# Patient Record
Sex: Female | Born: 1965 | Race: White | Hispanic: No | Marital: Married
Health system: Southern US, Academic
[De-identification: ages and names within clinical notes are randomized; demographics above are authoritative.]

## PROBLEM LIST (undated history)

## (undated) ENCOUNTER — Telehealth

## (undated) ENCOUNTER — Encounter: Attending: Dermatology | Primary: Dermatology

## (undated) ENCOUNTER — Ambulatory Visit: Payer: MEDICARE

## (undated) ENCOUNTER — Ambulatory Visit

## (undated) ENCOUNTER — Ambulatory Visit: Attending: Dermatology | Primary: Dermatology

## (undated) DIAGNOSIS — M1990 Unspecified osteoarthritis, unspecified site: Secondary | ICD-10-CM

## (undated) DIAGNOSIS — K589 Irritable bowel syndrome without diarrhea: Secondary | ICD-10-CM

## (undated) DIAGNOSIS — Z9882 Breast implant status: Secondary | ICD-10-CM

## (undated) DIAGNOSIS — K219 Gastro-esophageal reflux disease without esophagitis: Secondary | ICD-10-CM

## (undated) DIAGNOSIS — R0600 Dyspnea, unspecified: Secondary | ICD-10-CM

## (undated) DIAGNOSIS — J90 Pleural effusion, not elsewhere classified: Secondary | ICD-10-CM

## (undated) HISTORY — DX: Dyspnea, unspecified: R06.00

## (undated) HISTORY — DX: Gastro-esophageal reflux disease without esophagitis: K21.9

## (undated) HISTORY — PX: ABDOMINAL HYSTERECTOMY: SHX81

## (undated) HISTORY — DX: Pleural effusion, not elsewhere classified: J90

---

## 1997-10-21 HISTORY — PX: BREAST ENHANCEMENT SURGERY: SHX7

## 1997-10-21 HISTORY — PX: AUGMENTATION MAMMAPLASTY: SUR837

## 2005-07-11 ENCOUNTER — Ambulatory Visit: Payer: Self-pay | Admitting: Gastroenterology

## 2007-12-15 ENCOUNTER — Ambulatory Visit: Payer: Self-pay | Admitting: Rheumatology

## 2008-04-13 ENCOUNTER — Emergency Department: Payer: Self-pay | Admitting: Emergency Medicine

## 2008-04-20 ENCOUNTER — Ambulatory Visit: Payer: Self-pay | Admitting: Unknown Physician Specialty

## 2008-05-12 ENCOUNTER — Ambulatory Visit: Payer: Self-pay | Admitting: Unknown Physician Specialty

## 2008-05-26 ENCOUNTER — Ambulatory Visit: Payer: Self-pay | Admitting: Unknown Physician Specialty

## 2008-08-17 ENCOUNTER — Ambulatory Visit: Payer: Self-pay

## 2008-09-30 ENCOUNTER — Ambulatory Visit: Payer: Self-pay | Admitting: Unknown Physician Specialty

## 2008-10-06 ENCOUNTER — Ambulatory Visit: Payer: Self-pay | Admitting: Unknown Physician Specialty

## 2011-08-08 ENCOUNTER — Ambulatory Visit: Payer: Self-pay | Admitting: Family Medicine

## 2012-12-15 ENCOUNTER — Ambulatory Visit: Payer: Self-pay

## 2013-06-24 ENCOUNTER — Ambulatory Visit: Payer: Self-pay | Admitting: Unknown Physician Specialty

## 2013-06-25 LAB — PATHOLOGY REPORT

## 2014-03-09 DIAGNOSIS — M1990 Unspecified osteoarthritis, unspecified site: Secondary | ICD-10-CM | POA: Insufficient documentation

## 2014-09-13 ENCOUNTER — Emergency Department: Payer: Self-pay | Admitting: Emergency Medicine

## 2014-09-13 LAB — URINALYSIS, COMPLETE
Bacteria: NONE SEEN
Bilirubin,UR: NEGATIVE
Blood: NEGATIVE
GLUCOSE, UR: NEGATIVE mg/dL (ref 0–75)
Leukocyte Esterase: NEGATIVE
Nitrite: NEGATIVE
Ph: 6 (ref 4.5–8.0)
Protein: NEGATIVE
SPECIFIC GRAVITY: 1.008 (ref 1.003–1.030)
SQUAMOUS EPITHELIAL: NONE SEEN

## 2014-09-13 LAB — COMPREHENSIVE METABOLIC PANEL
ALK PHOS: 96 U/L
ALT: 24 U/L
ANION GAP: 6 — AB (ref 7–16)
Albumin: 3.6 g/dL (ref 3.4–5.0)
BUN: 9 mg/dL (ref 7–18)
Bilirubin,Total: 0.3 mg/dL (ref 0.2–1.0)
CALCIUM: 9.2 mg/dL (ref 8.5–10.1)
Chloride: 103 mmol/L (ref 98–107)
Co2: 30 mmol/L (ref 21–32)
Creatinine: 0.72 mg/dL (ref 0.60–1.30)
EGFR (African American): >60
EGFR (Non-African Amer.): >60
Glucose: 81 mg/dL (ref 65–99)
Osmolality: 275 (ref 275–301)
Potassium: 3.6 mmol/L (ref 3.5–5.1)
SGOT(AST): 27 U/L (ref 15–37)
Sodium: 139 mmol/L (ref 136–145)
Total Protein: 6.7 g/dL (ref 6.4–8.2)

## 2014-09-13 LAB — CBC
HCT: 38.3 % (ref 35.0–47.0)
HGB: 12.6 g/dL (ref 12.0–16.0)
MCH: 30.9 pg (ref 26.0–34.0)
MCHC: 32.8 g/dL (ref 32.0–36.0)
MCV: 94 fL (ref 80–100)
PLATELETS: 199 10*3/uL (ref 150–440)
RBC: 4.08 10*6/uL (ref 3.80–5.20)
RDW: 12.3 % (ref 11.5–14.5)
WBC: 7 10*3/uL (ref 3.6–11.0)

## 2014-09-13 LAB — TROPONIN I

## 2014-09-13 LAB — CK TOTAL AND CKMB (NOT AT ARMC)
CK, TOTAL: 81 U/L (ref 26–192)
CK-MB: 0.7 ng/mL (ref 0.5–3.6)

## 2014-09-13 LAB — TSH: Thyroid Stimulating Horm: 1.79 u[IU]/mL

## 2014-09-13 LAB — PRO B NATRIURETIC PEPTIDE: B-TYPE NATIURETIC PEPTID: 86 pg/mL (ref 0–125)

## 2014-10-12 ENCOUNTER — Ambulatory Visit: Payer: Self-pay | Admitting: Family Medicine

## 2014-11-23 ENCOUNTER — Encounter: Payer: Self-pay | Admitting: Rheumatology

## 2014-12-20 ENCOUNTER — Encounter
Admit: 2014-12-20 | Disposition: A | Discharge status: Home / Self Care | Payer: Self-pay | Attending: Rheumatology | Admitting: Rheumatology

## 2015-02-06 ENCOUNTER — Emergency Department
Admit: 2015-02-06 | Disposition: A | Discharge status: Home / Self Care | Payer: Self-pay | Admitting: Emergency Medicine

## 2015-02-06 LAB — URINALYSIS, COMPLETE
BLOOD: NEGATIVE
Bacteria: NONE SEEN
Bilirubin,UR: NEGATIVE
GLUCOSE, UR: NEGATIVE mg/dL (ref 0–75)
Ketone: NEGATIVE
Nitrite: NEGATIVE
PH: 5 (ref 4.5–8.0)
SPECIFIC GRAVITY: 1.027 (ref 1.003–1.030)

## 2015-02-06 LAB — COMPREHENSIVE METABOLIC PANEL
ALBUMIN: 3.7 g/dL
ALK PHOS: 98 U/L
ANION GAP: 8 (ref 7–16)
BUN: 13 mg/dL
Bilirubin,Total: 0.3 mg/dL
CHLORIDE: 103 mmol/L
CREATININE: 0.58 mg/dL
Calcium, Total: 8.7 mg/dL — ABNORMAL LOW
Co2: 26 mmol/L
EGFR (African American): >60
EGFR (Non-African Amer.): >60
GLUCOSE: 108 mg/dL — AB
Potassium: 3.7 mmol/L
SGOT(AST): 31 U/L
SGPT (ALT): 24 U/L
Sodium: 137 mmol/L
Total Protein: 6.7 g/dL

## 2015-02-06 LAB — CBC
HCT: 38.5 % (ref 35.0–47.0)
HGB: 12.8 g/dL (ref 12.0–16.0)
MCH: 29.3 pg (ref 26.0–34.0)
MCHC: 33.3 g/dL (ref 32.0–36.0)
MCV: 88 fL (ref 80–100)
Platelet: 271 10*3/uL (ref 150–440)
RBC: 4.38 10*6/uL (ref 3.80–5.20)
RDW: 13.3 % (ref 11.5–14.5)
WBC: 6.9 10*3/uL (ref 3.6–11.0)

## 2015-03-23 ENCOUNTER — Other Ambulatory Visit: Payer: Self-pay | Admitting: Nurse Practitioner

## 2015-03-23 DIAGNOSIS — R1084 Generalized abdominal pain: Secondary | ICD-10-CM

## 2015-03-23 DIAGNOSIS — R748 Abnormal levels of other serum enzymes: Secondary | ICD-10-CM

## 2015-03-30 ENCOUNTER — Ambulatory Visit: Admission: RE | Admit: 2015-03-30 | Payer: Self-pay | Source: Ambulatory Visit

## 2015-03-30 ENCOUNTER — Ambulatory Visit
Admission: RE | Admit: 2015-03-30 | Discharge: 2015-03-30 | Disposition: A | Discharge status: Home / Self Care | Payer: Self-pay | Source: Ambulatory Visit | Attending: Nurse Practitioner | Admitting: Nurse Practitioner

## 2015-03-30 DIAGNOSIS — M549 Dorsalgia, unspecified: Secondary | ICD-10-CM | POA: Insufficient documentation

## 2015-03-30 DIAGNOSIS — R197 Diarrhea, unspecified: Secondary | ICD-10-CM | POA: Insufficient documentation

## 2015-03-30 DIAGNOSIS — R05 Cough: Secondary | ICD-10-CM | POA: Insufficient documentation

## 2015-03-30 DIAGNOSIS — R1110 Vomiting, unspecified: Secondary | ICD-10-CM | POA: Insufficient documentation

## 2015-03-30 HISTORY — DX: Irritable bowel syndrome, unspecified: K58.9

## 2015-03-30 HISTORY — DX: Breast implant status: Z98.82

## 2015-03-30 MED ORDER — IOHEXOL 350 MG/ML SOLN
100.0000 mL | Freq: Once | INTRAVENOUS | Status: AC | PRN
  Administered 2015-03-30: 100 mL via INTRAVENOUS

## 2015-03-31 ENCOUNTER — Emergency Department
Admission: EM | Admit: 2015-03-31 | Discharge: 2015-03-31 | Disposition: A | Discharge status: Home / Self Care | Payer: Self-pay | Attending: Emergency Medicine | Admitting: Emergency Medicine

## 2015-03-31 ENCOUNTER — Encounter: Payer: Self-pay | Admitting: Emergency Medicine

## 2015-03-31 ENCOUNTER — Other Ambulatory Visit: Payer: Self-pay

## 2015-03-31 ENCOUNTER — Emergency Department: Payer: Self-pay

## 2015-03-31 DIAGNOSIS — R112 Nausea with vomiting, unspecified: Secondary | ICD-10-CM | POA: Insufficient documentation

## 2015-03-31 DIAGNOSIS — R079 Chest pain, unspecified: Secondary | ICD-10-CM

## 2015-03-31 DIAGNOSIS — Z87891 Personal history of nicotine dependence: Secondary | ICD-10-CM | POA: Insufficient documentation

## 2015-03-31 DIAGNOSIS — R0789 Other chest pain: Secondary | ICD-10-CM | POA: Insufficient documentation

## 2015-03-31 DIAGNOSIS — J90 Pleural effusion, not elsewhere classified: Secondary | ICD-10-CM | POA: Insufficient documentation

## 2015-03-31 DIAGNOSIS — R0602 Shortness of breath: Secondary | ICD-10-CM

## 2015-03-31 DIAGNOSIS — R1084 Generalized abdominal pain: Secondary | ICD-10-CM | POA: Insufficient documentation

## 2015-03-31 LAB — BASIC METABOLIC PANEL
ANION GAP: 9 (ref 5–15)
BUN: <5 mg/dL — ABNORMAL LOW (ref 6–20)
CO2: 30 mmol/L (ref 22–32)
Calcium: 9 mg/dL (ref 8.9–10.3)
Chloride: 100 mmol/L — ABNORMAL LOW (ref 101–111)
Creatinine, Ser: 0.59 mg/dL (ref 0.44–1.00)
GFR calc Af Amer: >60 mL/min (ref >60)
GFR calc non Af Amer: >60 mL/min (ref >60)
Glucose, Bld: 90 mg/dL (ref 65–99)
Potassium: 3.8 mmol/L (ref 3.5–5.1)
Sodium: 139 mmol/L (ref 135–145)

## 2015-03-31 LAB — URINALYSIS COMPLETE WITH MICROSCOPIC (ARMC ONLY)
Bacteria, UA: NONE SEEN
Bilirubin Urine: NEGATIVE
Glucose, UA: NEGATIVE mg/dL
Hgb urine dipstick: NEGATIVE
Ketones, ur: NEGATIVE mg/dL
NITRITE: NEGATIVE
PH: 7 (ref 5.0–8.0)
Protein, ur: NEGATIVE mg/dL
RBC / HPF: NONE SEEN RBC/hpf (ref 0–5)
SPECIFIC GRAVITY, URINE: 1.017 (ref 1.005–1.030)

## 2015-03-31 LAB — CBC
HEMATOCRIT: 40.1 % (ref 35.0–47.0)
Hemoglobin: 13.1 g/dL (ref 12.0–16.0)
MCH: 28.4 pg (ref 26.0–34.0)
MCHC: 32.6 g/dL (ref 32.0–36.0)
MCV: 87.1 fL (ref 80.0–100.0)
PLATELETS: 280 10*3/uL (ref 150–440)
RBC: 4.6 MIL/uL (ref 3.80–5.20)
RDW: 13.9 % (ref 11.5–14.5)
WBC: 7.2 10*3/uL (ref 3.6–11.0)

## 2015-03-31 LAB — TROPONIN I: Troponin I: <0.03 ng/mL (ref <0.031)

## 2015-03-31 MED ORDER — SODIUM CHLORIDE 0.9 % IV SOLN
Freq: Once | INTRAVENOUS | Status: AC
  Administered 2015-03-31: 15:00:00 via INTRAVENOUS

## 2015-03-31 MED ORDER — IOHEXOL 350 MG/ML SOLN
100.0000 mL | Freq: Once | INTRAVENOUS | Status: AC | PRN
Start: 2015-03-31 — End: 2015-03-31
  Administered 2015-03-31: 75 mL via INTRAVENOUS

## 2015-03-31 NOTE — ED Notes (Signed)
Family at bedside. 

## 2015-03-31 NOTE — ED Notes (Signed)
Patient transported to CT 

## 2015-03-31 NOTE — Discharge Instructions (Signed)
Continue home medications. Rest.  As discussed it is very important to follow-up with pulmonology on Monday. See above to call to schedule first thing Monday morning.   Also as discussed return immediately to the ER for increased chest pain, shortness of breath, abdominal pain, fever, new or worsening concerns.   Chest Pain (Nonspecific) It is often hard to give a specific diagnosis for the cause of chest pain. There is always a chance that your pain could be related to something serious, such as a heart attack or a blood clot in the lungs. You need to follow up with your health care provider for further evaluation. CAUSES   Heartburn.  Pneumonia or bronchitis.  Anxiety or stress.  Inflammation around your heart (pericarditis) or lung (pleuritis or pleurisy).  A blood clot in the lung.  A collapsed lung (pneumothorax). It can develop suddenly on its own (spontaneous pneumothorax) or from trauma to the chest.  Shingles infection (herpes zoster virus). The chest wall is composed of bones, muscles, and cartilage. Any of these can be the source of the pain.  The bones can be bruised by injury.  The muscles or cartilage can be strained by coughing or overwork.  The cartilage can be affected by inflammation and become sore (costochondritis). DIAGNOSIS  Lab tests or other studies may be needed to find the cause of your pain. Your health care provider may have you take a test called an ambulatory electrocardiogram (ECG). An ECG records your heartbeat patterns over a 24-hour period. You may also have other tests, such as:  Transthoracic echocardiogram (TTE). During echocardiography, sound waves are used to evaluate how blood flows through your heart.  Transesophageal echocardiogram (TEE).  Cardiac monitoring. This allows your health care provider to monitor your heart rate and rhythm in real time.  Holter monitor. This is a portable device that records your heartbeat and can help  diagnose heart arrhythmias. It allows your health care provider to track your heart activity for several days, if needed.  Stress tests by exercise or by giving medicine that makes the heart beat faster. TREATMENT   Treatment depends on what may be causing your chest pain. Treatment may include:  Acid blockers for heartburn.  Anti-inflammatory medicine.  Pain medicine for inflammatory conditions.  Antibiotics if an infection is present.  You may be advised to change lifestyle habits. This includes stopping smoking and avoiding alcohol, caffeine, and chocolate.  You may be advised to keep your head raised (elevated) when sleeping. This reduces the chance of acid going backward from your stomach into your esophagus. Most of the time, nonspecific chest pain will improve within 2-3 days with rest and mild pain medicine.  HOME CARE INSTRUCTIONS   If antibiotics were prescribed, take them as directed. Finish them even if you start to feel better.  For the next few days, avoid physical activities that bring on chest pain. Continue physical activities as directed.  Do not use any tobacco products, including cigarettes, chewing tobacco, or electronic cigarettes.  Avoid drinking alcohol.  Only take medicine as directed by your health care provider.  Follow your health care provider's suggestions for further testing if your chest pain does not go away.  Keep any follow-up appointments you made. If you do not go to an appointment, you could develop lasting (chronic) problems with pain. If there is any problem keeping an appointment, call to reschedule. SEEK MEDICAL CARE IF:   Your chest pain does not go away, even after treatment.  You have a rash with blisters on your chest.  You have a fever. SEEK IMMEDIATE MEDICAL CARE IF:   You have increased chest pain or pain that spreads to your arm, neck, jaw, back, or abdomen.  You have shortness of breath.  You have an increasing cough,  or you cough up blood.  You have severe back or abdominal pain.  You feel nauseous or vomit.  You have severe weakness.  You faint.  You have chills. This is an emergency. Do not wait to see if the pain will go away. Get medical help at once. Call your local emergency services (911 in [place] Do not drive yourself to the hospital. MAKE SURE YOU:   Understand these instructions.  Will watch your condition.  Will get help right away if you are not doing well or get worse. Document Released: 07/17/2005 Document Revised: 10/12/2013 Document Reviewed: 05/12/2008 [facility] Patient Information 2015 [place], [place]. This information is not intended to replace advice given to you by your health care provider. Make sure you discuss any questions you have with your health care provider.  Pleural Effusion The lining covering your lungs and the inside of your chest is called the pleura. Usually, the space between the two pleura contains no air and only a thin layer of fluid. A pleural effusion is an abnormal buildup of fluid in the pleural space. Fluid gathers when there is increased pressure in the lung vessels. This forces fluids out of the lungs and into the pleural space. Vessels may also leak fluids when there are infections, such as pneumonia, or other causes of soreness and redness (inflammation). Fluids leak into the lungs when protein in the blood is low or when certain vessels (lymphatics) are blocked. Finding a pleural effusion is important because it is usually caused by another disease. In order to treat a pleural effusion, your health care provider needs to find its cause. If left untreated, a large amount of fluid can build up and cause collapse of the lung. CAUSES   Heart failure.  Infections (pneumonia, tuberculosis), pulmonary embolism, pulmonary infarction.  Cancer (primary lung and metastatic), asbestosis.  Liver failure (cirrhosis).  Nephrotic syndrome, peritoneal dialysis,  kidney problems (uremia).  Collagen vascular disease (systemic lupus erythematosus, rheumatoid arthritis).  Injury (trauma) to the chest or rupture of the digestive tube (esophagus).  Material in the chest or pleural space (hemothorax, chylothorax).  Pancreatitis.  Surgery.  Drug reactions. SYMPTOMS  A pleural effusion can decrease the amount of space available for breathing and make you short of breath. The fluid can become infected, which may cause pain and fever. Often, the pain is worse when taking a deep breath. The underlying disease (heart failure, pneumonia, blood clot, tuberculosis, cancer) may also cause symptoms. DIAGNOSIS   Your health care provider can usually tell what is wrong by talking to you (taking a history), doing an exam, and taking a routine X-ray. If the X-ray shows fluid in your chest, often fluid is removed from your chest with a needle for testing (diagnostic thoracentesis).  Sometimes, more specialized X-rays may be needed.  Sometimes, a small piece of tissue is removed and examined by a specialist (biopsy). TREATMENT  Treatment varies based on what caused the pleural effusion. Treatments include:  Removing as much fluid as possible using a needle (thoracentesis) to improve the cough and shortness of breath. This is a simple procedure that can be done at bedside. The risks are bleeding, infection, collapse of a lung, or low  blood pressure.  Placing a tube in the chest to drain the effusion (tube thoracostomy). This is often used when there is an infection in the fluid. This is a simple procedure that can often be done at bedside or in a clinic. The procedure may be painful. The risks are the same as using a needle to drain the fluid. The chest tube usually remains for a few days and is connected to suction to improve fluid drainage. After placement, the tube usually does not cause much discomfort.  Surgical removal of fibrous debris in and around the pleural  space (decortication). This may be done with a flexible telescope (thoracoscope) through a small or large cut (incision). This is helpful for patients who have fibrosis or scar tissue that prevents complete lung expansion. The risks are infection, blood loss, and side effects from general anesthesia.  Sometimes, a procedure called pleurodesis is done. A chest tube is placed and the fluid is drained. Next, an agent (tetracycline, talc powder) is added to the pleural space. This causes the lung and chest wall to stick together (adhesion). This leaves no potential space for fluid to build up. The risks include infection, blood loss, and side effects from general anesthesia.  If the effusion is caused by infection, it may be treated with antibiotics and may improve without draining. HOME CARE INSTRUCTIONS   Take any medicines exactly as prescribed.  Follow up with your health care provider as directed.  Monitor your exercise capacity (the amount of walking you can do before you get short of breath).  Do not use any tobacco products including cigarettes, chewing tobacco, or electronic cigarettes. SEEK MEDICAL CARE IF:   Your exercise capacity seems to get worse or does not improve with time.  You do not recover from your illness.  You have drainage, redness, swelling, or pain at any incision or puncture sites. SEEK IMMEDIATE MEDICAL CARE IF:   Shortness of breath or chest pain develops or gets worse.  You have a fever.  You develop a new cough, especially if the mucus (phlegm) is discolored. MAKE SURE YOU:   Understand these instructions.  Will watch your condition.  Will get help right away if you are not doing well or get worse. Document Released: 10/07/2005 Document Revised: 02/21/2014 Document Reviewed: 05/29/2007 [facility] Patient Information 2015 [place], [place]. This information is not intended to replace advice given to you by your health care provider. Make sure you discuss any  questions you have with your health care provider.

## 2015-03-31 NOTE — ED Notes (Signed)
Pt to ed with c/o chest pain intermittent x several months.  Pt reports sob, weakness, nausea and vomiting assoc with chest pain.

## 2015-03-31 NOTE — ED Provider Notes (Signed)
[facility] Emergency Department Provider Note ____________________________________________  Time seen: Approximately 2:11 PM  I have reviewed the triage vital signs and the nursing notes.   HISTORY  Chief Complaint Chest Pain   HPI Margaret Diaz is a 49 y.o. female presents to the ER for the complaint of 8 months of chest pain and shortness of breath. Patient states that she has also had intermittent nausea, vomiting and diarrhea and generalized abdominal pain throughout that 8 months as well however states that the GI symptoms have been more persistent for the last 2-3 weeks. States however the chest pain and shortness of breath has been persistent and constant during that 8-9 months. Patient describes the chest pain as pain across the front bra line of her chest. States that the pain is worse with movement as well as palpation but also present at rest. Describes pain as 7 out of 10. Patient states that shortness of breath also present at rest, but also increases with exertion.  Patient also reports 3 urinary tract infections in the last 8 months with last antibiotic being Cipro. Denies recent injury. Patient reports that she has been evaluated for this same by her primary care physician Dr. Burnett Sheng. Also reports that she is being followed by GI for abdominal complaints. Patient states that she had an outpatient CT abdomen yesterday and at that time they noticed that she had had a increase and a right lower pleural effusion and which that she was encouraged to follow up with her primary care physician. Patient states that her primary care physician is out of town and rest was referred to the ER for further management.   Past Medical History  Diagnosis Date  . Irritable bowel syndrome (IBS)   . Hx of bilateral breast implants     There are no active problems to display for this patient.   Past Surgical History  Procedure Laterality Date  . Abdominal hysterectomy       No current outpatient prescriptions on file. Patient reports that she is a chronic pain patient that follows with Dr. Laban Emperor and she takes oxycodone daily for "joint pain " Allergies Erythromycin  History reviewed. No pertinent family history.  Social History History  Substance Use Topics  . Smoking status: Former Games developer  . Smokeless tobacco: Not on file  . Alcohol Use: No    Review of Systems Constitutional: No fever/chills Eyes: No visual changes. ENT: No sore throat. Cardiovascular: Positive chest pain. Respiratory: Positive shortness of breath. Gastrointestinal: Positive for generalized abdominal pain, nausea, vomiting, diarrhea as above.  No constipation. Genitourinary: Negative for dysuria. Musculoskeletal: Negative for back pain. Skin: Negative for rash. Neurological: Negative for headaches, focal weakness or numbness.  10-point ROS otherwise negative.  ____________________________________________   PHYSICAL EXAM:  VITAL SIGNS: ED Triage Vitals  Enc Vitals Group     BP 03/31/15 1233 129/82 mmHg     Pulse Rate 03/31/15 1233 74     Resp 03/31/15 1233 18     Temp 03/31/15 1233 98.2 F (36.8 C)     Temp Source 03/31/15 1233 Oral     SpO2 03/31/15 1233 96 %     Weight 03/31/15 1233 170 lb (77.111 kg)     Height 03/31/15 1233  (1.702 m)     Head Cir --      Peak Flow --      Pain Score 03/31/15 1235 6     Pain Loc --  Pain Edu? --      Excl. in GC? --    Today's Vitals   03/31/15 1235 03/31/15 1509 03/31/15 1530 03/31/15 1818  BP:  115/85 109/76 130/88  Pulse:  70 73 81  Temp:      TempSrc:      Resp:  18  18  Height:      Weight:      SpO2:  99% 100% 99%  PainSc: 6  5   0-No pain   Constitutional: Alert and oriented. Well appearing and in no acute distress. Eyes: Conjunctivae are normal. PERRL. EOMI. Head: Atraumatic. Nose: No congestion/rhinnorhea. Mouth/Throat: Mucous membranes are moist.  Oropharynx non-erythematous. Neck:  No stridor.  No cervical spine tenderness to palpation. Hematological/Lymphatic/Immunilogical: No cervical lymphadenopathy. Cardiovascular: Normal rate, regular rhythm. Grossly normal heart sounds.  Good peripheral circulation. Mild to moderate tender to palpation lower chest as well as below left and right breast. No ecchymosis, swelling or erythema. Respiratory: Normal respiratory effort.  No retractions. Lungs CTAB. Except mildly decreased Right lower breath sounds. Gastrointestinal: Soft. No distention. No abdominal bruits. No CVA tenderness. Mild tender to palpation generalized. No guarding. Musculoskeletal: No lower extremity tenderness nor edema.  No joint effusions. Bilateral distal pedal pulses equal and easily found. Neurologic:  Normal speech and language. No gross focal neurologic deficits are appreciated. Speech is normal. No gait instability. Cranial nerve II through XII grossly intact. Skin:  Skin is warm, dry and intact. No rash noted. Psychiatric: Mood and affect are normal. Speech and behavior are normal.  ____________________________________________   LABS (all labs ordered are listed, but only abnormal results are displayed)  Labs Reviewed  BASIC METABOLIC PANEL - Abnormal; Notable for the following:    Chloride 100 (*)    BUN <5 (*)    All other components within normal limits  URINALYSIS COMPLETEWITH MICROSCOPIC ([facility] ONLY) - Abnormal; Notable for the following:    Color, Urine COLORLESS (*)    APPearance CLEAR (*)    Leukocytes, UA TRACE (*)    Squamous Epithelial / LPF 0-5 (*)    All other components within normal limits  C DIFFICILE QUICK SCAN W PCR REFLEX ([facility] ONLY)  CBC  TROPONIN I   ____________________________________________  EKG   ____________________________________________  RADIOLOGY Recent CT abdomen results reviewed from yesterday. CT ANGIOGRAPHY CHEST WITH CONTRAST  TECHNIQUE: Multidetector CT imaging of the chest was performed using  the standard protocol during bolus administration of intravenous contrast. Multiplanar CT image reconstructions and MIPs were obtained to evaluate the vascular anatomy.  CONTRAST: 75mL OMNIPAQUE IOHEXOL 350 MG/ML SOLN  COMPARISON: None.  FINDINGS: Chest wall: No breast masses are identified. Bilateral breast prosthesis are noted. No supraclavicular or axillary mass or adenopathy. The thyroid gland appears normal. The bony thorax is intact.  Mediastinum: The heart is normal in size. No pericardial effusion. Small amount of fluid in the pericardial recesses. No mediastinal or hilar mass or adenopathy. There are small perihilar lymph nodes bilaterally. The esophagus is grossly normal.  Vascular: The aorta is normal in caliber. No dissection. The branch vessels are patent. The pulmonary arterial tree is well opacified. No filling defects to suggest pulmonary emboli.  Lungs/ pleura: There is a moderate-sized right pleural effusion with overlying atelectasis. No left-sided pleural effusion but there is left basilar atelectasis along with other patchy areas of atelectasis. This also patchy ground-glass opacity which is a nonspecific finding but can be seen with edema, alveolitis, reactive airways disease and respiratory bronchiolitis.  Upper  abdomen: No significant findings.  Review of the MIP images confirms the above findings.  IMPRESSION: 1. Moderate-sized right pleural effusion with overlying atelectasis. 2. Patchy somewhat mosaic pattern of ground-glass opacity in the lungs could be due to edema, alveolitis, reactive airways disease or respiratory bronchiolitis. BOOP would also be possible. 3. No CT findings for pulmonary embolism. 4. Normal thoracic aorta.   Electronically Signed By: Rudie Meyer M.D. On: 03/31/2015 16:01 ____________________________________________   ____________________________________________   INITIAL IMPRESSION / ASSESSMENT AND PLAN /  ED COURSE  Pertinent labs & imaging results that were available during my care of the patient were reviewed by me and considered in my medical decision making (see chart for details).  Presents to the ER for the complaints of 8 months of chest pain and shortness of breath as well as with accompanying nausea, vomiting, diarrhea and generalized abdominal pain. Patient following with her primary care physician as well as GI for the same. Presents today as with recent CT abdomen recent as yesterday showing increased right pleural effusion. States here today for further evaluation. Very well-appearing patient in no acute distress. States chest pain and shortness of breath unchanged on last 8 months. Will evaluate by CT chest.  1740: Discussed pt and plan of care with Dr Shellee Milo (pulmonology) who also reviewed CT scan. Dr Welton Flakes recommends pt to follow up in his office on Monday for outpatient management and does not recommend admission at this time.Dr Welton Flakes recommends pt will need thoracentesis for evaluation and diagnosis. Per Dr Welton Flakes pt to follow up in office on Monday with him. Pt with normal and stable vital signs in ER, 100 % room air and 18 respirations at this time. Respirations nonlabored. NO acute distress.   Discussed patient and plan of care with Dr Lenard Lance who also agrees with plan. Pt with complaints which have been present over 8 months. No change in chest pain or shortness of breath. Ct chest positive for right sided pleural effusion. Pt to follow up with pulmonology on Monday. Very strict follow up and return parameters given to pt and family. Pt agreed to plan. Pt verbalized also wanting to go home.  ____________________________________________   FINAL CLINICAL IMPRESSION(S) / ED DIAGNOSES  Final diagnoses:  Pleural effusion  Chest pain, unspecified chest pain type  Shortness of breath      Renford Dills, NP 03/31/15 1905  Minna Antis, MD 03/31/15 2326

## 2015-03-31 NOTE — ED Provider Notes (Signed)
-----------------------------------------   6:18 PM on 03/31/2015 -----------------------------------------  EKG reviewed and interpreted by myself shows normal sinus rhythm at 85 bpm, narrow QRS, normal axis, normal intervals, nonspecific but no concerning ST changes are present.  Minna Antis, MD 03/31/15 [phone]

## 2015-03-31 NOTE — ED Notes (Signed)
Patient denies pain and is resting comfortably.  

## 2015-04-06 ENCOUNTER — Institutional Professional Consult (permissible substitution): Payer: Self-pay | Admitting: Internal Medicine

## 2015-04-10 ENCOUNTER — Ambulatory Visit (INDEPENDENT_AMBULATORY_CARE_PROVIDER_SITE_OTHER): Payer: Self-pay | Admitting: Internal Medicine

## 2015-04-10 ENCOUNTER — Encounter: Payer: Self-pay | Admitting: Internal Medicine

## 2015-04-10 VITALS — BP 104/76 | HR 83 | Ht 67.0 in | Wt 183.0 lb

## 2015-04-10 DIAGNOSIS — J90 Pleural effusion, not elsewhere classified: Secondary | ICD-10-CM

## 2015-04-10 DIAGNOSIS — J948 Other specified pleural conditions: Secondary | ICD-10-CM

## 2015-04-10 HISTORY — DX: Pleural effusion, not elsewhere classified: J90

## 2015-04-10 NOTE — Patient Instructions (Signed)
Please see patient coordinator before you leave today  to schedule ultrasound guided R thoracentesis

## 2015-04-10 NOTE — Progress Notes (Signed)
Subjective:    Patient ID: Margaret Diaz, female    DOB: Jun 16, 1966    MRN: [id number]  HPI  49 yowf quit smoking in 2006 s sequelae but bad arthritis/ followed by [facility]/ fall of 2015 developed cough/sob/ R cp with CT pos R effusion so referred to pulmonary clinic 04/10/2015  By EDP at [facility].    04/10/2015 1st [facility] office visit/ Aijalon Demuro   Chief Complaint  Patient presents with  . Pulmonary Consult    Self referral. Pt c/o SOB and CP "for quite some time"- worse x 2 wks. She states pain is worse when she takes a deep breath. She gets SOB with talking and walking "not too far". She also c/o cough- occ prod with grey sputum.   8 m  prior to OV  Onset bilateral R> L lower cp  hurt to deep breath with worse than usual arthritis  Then gradually worse sob and more localization of pain on R - "nothing helps"  No obvious   day to day or daytime variabilty or assoc chronic cough or cp or chest tightness, subjective wheeze overt sinus or hb symptoms. No unusual exp hx or h/o childhood pna/ asthma or knowledge of premature birth.  Sleeping ok without nocturnal  or early am exacerbation  of respiratory  c/o's or need for noct saba. Also denies any obvious fluctuation of symptoms with weather or environmental changes or other aggravating or alleviating factors except as outlined above   Current Medications, Allergies, Complete Past Medical History, Past Surgical History, Family History, and Social History were reviewed in [employer] record.              Review of Systems  Constitutional: Negative for fever, chills and unexpected weight change.  HENT: Positive for sore throat. Negative for congestion, dental problem, ear pain, nosebleeds, postnasal drip, rhinorrhea, sinus pressure, sneezing, trouble swallowing and voice change.   Eyes: Negative for visual disturbance.  Respiratory: Positive for cough and shortness of breath. Negative for choking.     Cardiovascular: Positive for chest pain. Negative for leg swelling.  Gastrointestinal: Positive for vomiting. Negative for abdominal pain and diarrhea.  Genitourinary: Negative for difficulty urinating.  Musculoskeletal: Negative for arthralgias.  Skin: Negative for rash.  Neurological: Negative for tremors, syncope and headaches.  Hematological: Does not bruise/bleed easily.       Objective:   Physical Exam  amb wf nad but has trouble climbing on exam table due to arthritis   Wt Readings from Last 3 Encounters:  04/10/15 183 lb (83.008 kg)  03/31/15 170 lb (77.111 kg)  03/30/15 175 lb (79.379 kg)    Vital signs reviewed  HEENT: nl dentition, turbinates, and orophanx. Nl external ear canals without cough reflex   NECK :  without JVD/Nodes/TM/ nl carotid upstrokes bilaterally   LUNGS: no acc muscle use, decreaesed bs with dullness R base - otherwise clear    CV:  RRR  no s3 or murmur or increase in P2, no edema   ABD:  soft and nontender with nl excursion in the supine position. No bruits or organomegaly, bowel sounds nl  MS:  warm without deformities, calf tenderness, cyanosis or clubbing  SKIN: warm and dry without lesions    NEURO:  alert, approp, no deficits      I personally reviewed images and agree with radiology impression as follows:   CTa  Chest 03/31/15  1. Moderate-sized right pleural effusion with overlying atelectasis. 2. Patchy  somewhat mosaic pattern of ground-glass opacity in the lungs could be due to edema, alveolitis, reactive airways disease or respiratory bronchiolitis. BOOP would also be possible. 3. No CT findings for pulmonary embolism.      Assessment & Plan:

## 2015-04-11 ENCOUNTER — Encounter: Payer: Self-pay | Admitting: Internal Medicine

## 2015-04-11 ENCOUNTER — Ambulatory Visit (HOSPITAL_COMMUNITY)
Admission: RE | Admit: 2015-04-11 | Discharge: 2015-04-11 | Disposition: A | Discharge status: Home / Self Care | Payer: Self-pay | Source: Ambulatory Visit | Attending: Radiology | Admitting: Radiology

## 2015-04-11 ENCOUNTER — Ambulatory Visit (HOSPITAL_COMMUNITY)
Admission: RE | Admit: 2015-04-11 | Discharge: 2015-04-11 | Disposition: A | Discharge status: Home / Self Care | Payer: Self-pay | Source: Ambulatory Visit | Attending: Internal Medicine | Admitting: Internal Medicine

## 2015-04-11 VITALS — BP 90/65

## 2015-04-11 DIAGNOSIS — J90 Pleural effusion, not elsewhere classified: Secondary | ICD-10-CM | POA: Insufficient documentation

## 2015-04-11 DIAGNOSIS — Z9889 Other specified postprocedural states: Secondary | ICD-10-CM

## 2015-04-11 LAB — PROTEIN, BODY FLUID: Total protein, fluid: 4.5 g/dL

## 2015-04-11 LAB — GLUCOSE, SEROUS FLUID: GLUCOSE FL: 83 mg/dL

## 2015-04-11 LAB — LACTATE DEHYDROGENASE, PLEURAL OR PERITONEAL FLUID: LD, Fluid: 281 U/L — ABNORMAL HIGH (ref 3–23)

## 2015-04-11 NOTE — Procedures (Signed)
US guided diagnostic/therapeutic right thoracentesis performed yielding 550 cc's turbid, amber fluid. The fluid was sent to the lab for preordered studies. F/u CXR pending. No immediate complications.

## 2015-04-11 NOTE — Assessment & Plan Note (Signed)
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

## 2015-04-12 NOTE — Progress Notes (Signed)
Quick Note:  Spoke with pt and notified of results per Dr. Wert. Pt verbalized understanding and denied any questions.  ______ 

## 2015-04-16 LAB — CULTURE, BODY FLUID-BOTTLE: CULTURE: NO GROWTH

## 2015-04-16 LAB — CULTURE, BODY FLUID W GRAM STAIN -BOTTLE

## 2015-04-26 ENCOUNTER — Ambulatory Visit (INDEPENDENT_AMBULATORY_CARE_PROVIDER_SITE_OTHER)
Admission: RE | Admit: 2015-04-26 | Discharge: 2015-04-26 | Disposition: A | Discharge status: Home / Self Care | Payer: Self-pay | Source: Ambulatory Visit | Attending: Internal Medicine | Admitting: Internal Medicine

## 2015-04-26 ENCOUNTER — Encounter: Payer: Self-pay | Admitting: Internal Medicine

## 2015-04-26 ENCOUNTER — Ambulatory Visit (INDEPENDENT_AMBULATORY_CARE_PROVIDER_SITE_OTHER): Payer: Self-pay | Admitting: Internal Medicine

## 2015-04-26 VITALS — BP 110/78 | HR 103 | Ht 67.0 in | Wt 186.0 lb

## 2015-04-26 DIAGNOSIS — J90 Pleural effusion, not elsewhere classified: Secondary | ICD-10-CM

## 2015-04-26 DIAGNOSIS — J948 Other specified pleural conditions: Secondary | ICD-10-CM

## 2015-04-26 MED ORDER — METHYLPREDNISOLONE ACETATE 80 MG/ML IJ SUSP
120.0000 mg | Freq: Once | INTRAMUSCULAR | Status: AC
  Administered 2015-04-26: 120 mg via INTRAMUSCULAR

## 2015-04-26 NOTE — Progress Notes (Signed)
Subjective:    Patient ID: Margaret Diaz, female    DOB: [birth year]    MRN: [id number]    Brief patient profile:  49 yowf quit smoking in 2006 s sequelae but bad arthritis/ followed by [facility]/ fall of 2015 developed cough/sob/ R cp with CT pos R effusion so referred to pulmonary clinic 04/10/2015  By EDP at [facility].    History of Present Illness  04/10/2015 1st  Pulmonary office visit/ Margaret Diaz   Chief Complaint  Patient presents with  . Pulmonary Consult    Self referral. Pt c/o SOB and CP "for quite some time"- worse x 2 wks. She states pain is worse when she takes a deep breath. She gets SOB with talking and walking "not too far". She also c/o cough- occ prod with grey sputum.   8 m  prior to OV  Onset bilateral R> L lower cp  hurt to deep breath with worse than usual arthritis  Then gradually worse sob and more localization of pain on R - "nothing helps" rec U/s thoracentesis    04/11/15 R thoracetesis x 520 cc exudate/glucose nl  Cyt > ACUTE INFLAMMATION AND EOSINOPHILS,    04/26/2015 f/u ov/[name] re: probable parapneumonic effusion   Chief Complaint  Patient presents with  . Follow-up    Pt states breathing is unchanged. Pt c/o SOB and wheezing. Occasional prod cough with clear thick mucus during early morning.      arthritis also worse than usual / min pain R chest with deep breath  No obvious day to day or daytime variabilty or assoc   cp or chest tightness, subjective wheeze overt sinus or hb symptoms. No unusual exp hx or h/o childhood pna/ asthma or knowledge of premature birth.  Sleeping ok without nocturnal  or early am exacerbation  of respiratory  c/o's or need for noct saba. Also denies any obvious fluctuation of symptoms with weather or environmental changes or other aggravating or alleviating factors except as outlined above   Current Medications, Allergies, Complete Past Medical History, Past Surgical History, Family History, and Social History were  reviewed in [employer] record.  ROS  The following are not active complaints unless bolded sore throat, dysphagia, dental problems, itching, sneezing,  nasal congestion or excess/ purulent secretions, ear ache,   fever, chills, sweats, unintended wt loss,   exertional cp, hemoptysis,  orthopnea pnd or leg swelling, presyncope, palpitations, abdominal pain, anorexia, nausea, vomiting, diarrhea  or change in bowel or urinary habits, change in stools or urine, dysuria,hematuria,  rash, arthralgias, visual complaints, headache, numbness weakness or ataxia or problems with walking or coordination,  change in mood/affect or memory.                      Objective:   Physical Exam  amb wf nad     04/26/2015          186  Wt Readings from Last 3 Encounters:  04/10/15 183 lb (83.008 kg)  03/31/15 170 lb (77.111 kg)  03/30/15 175 lb (79.379 kg)    Vital signs reviewed  HEENT: nl dentition, turbinates, and orophanx. Nl external ear canals without cough reflex   NECK :  without JVD/Nodes/TM/ nl carotid upstrokes bilaterally   LUNGS: no acc muscle use, decreaesed bs with dullness R base - otherwise clear    CV:  RRR  no s3 or murmur or increase in P2, no edema   ABD:  soft and  nontender with nl excursion in the supine position. No bruits or organomegaly, bowel sounds nl  MS:  warm without deformities, calf tenderness, cyanosis or clubbing  SKIN: warm and dry without lesions    NEURO:  alert, approp, no deficits      I personally reviewed images and agree with radiology impression as follows:  cxr 04/26/15 Interval increase in the small right pleural effusion. Increased density at the right lung base may reflect atelectasis or infiltrate.     Assessment & Plan:

## 2015-04-26 NOTE — Patient Instructions (Addendum)
Omeprazole 20 Take 30- 60 min before your first and last meals of the day   depomedrol 120 mg IM today   Please remember to go to the lab   department downstairs for your tests - we will call you with the results when they are available.    Please schedule a follow up office visit in 2 weeks, sooner if needed with cxr on return  Add did not go to lab as requested

## 2015-04-27 ENCOUNTER — Encounter: Payer: Self-pay | Admitting: Internal Medicine

## 2015-04-27 NOTE — Assessment & Plan Note (Signed)
Assoc with eosinophilia which is completely non-specific but gives me hope this is a benign process that will heal s intervention  Discussed in detail all the  indications, usual  risks and alternatives  relative to the benefits with patient who  Refused pred (makes her crazy after 4 days but shot's ok) > rx depomedrol 120 IM and f/u 2 weeks  ?? 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

## 2015-05-08 LAB — GRAM STAIN

## 2015-05-10 ENCOUNTER — Other Ambulatory Visit (INDEPENDENT_AMBULATORY_CARE_PROVIDER_SITE_OTHER): Payer: Self-pay

## 2015-05-10 ENCOUNTER — Encounter: Payer: Self-pay | Admitting: Internal Medicine

## 2015-05-10 ENCOUNTER — Ambulatory Visit (INDEPENDENT_AMBULATORY_CARE_PROVIDER_SITE_OTHER): Payer: Self-pay | Admitting: Internal Medicine

## 2015-05-10 ENCOUNTER — Ambulatory Visit (INDEPENDENT_AMBULATORY_CARE_PROVIDER_SITE_OTHER)
Admission: RE | Admit: 2015-05-10 | Discharge: 2015-05-10 | Disposition: A | Discharge status: Home / Self Care | Payer: Self-pay | Source: Ambulatory Visit | Attending: Internal Medicine | Admitting: Internal Medicine

## 2015-05-10 VITALS — BP 110/84 | HR 93 | Ht 67.0 in | Wt 184.0 lb

## 2015-05-10 DIAGNOSIS — R300 Dysuria: Secondary | ICD-10-CM

## 2015-05-10 DIAGNOSIS — J90 Pleural effusion, not elsewhere classified: Secondary | ICD-10-CM

## 2015-05-10 DIAGNOSIS — R0600 Dyspnea, unspecified: Secondary | ICD-10-CM

## 2015-05-10 DIAGNOSIS — J948 Other specified pleural conditions: Secondary | ICD-10-CM

## 2015-05-10 HISTORY — DX: Dyspnea, unspecified: R06.00

## 2015-05-10 LAB — CBC WITH DIFFERENTIAL/PLATELET
BASOS ABS: 0 10*3/uL (ref 0.0–0.1)
Basophils Relative: 0.5 % (ref 0.0–3.0)
Eosinophils Absolute: 0.3 10*3/uL (ref 0.0–0.7)
Eosinophils Relative: 3.1 % (ref 0.0–5.0)
HEMATOCRIT: 42 % (ref 36.0–46.0)
Hemoglobin: 14 g/dL (ref 12.0–15.0)
LYMPHS ABS: 3.3 10*3/uL (ref 0.7–4.0)
LYMPHS PCT: 33.3 % (ref 12.0–46.0)
MCHC: 33.4 g/dL (ref 30.0–36.0)
MCV: 83.9 fl (ref 78.0–100.0)
Monocytes Absolute: 0.8 10*3/uL (ref 0.1–1.0)
Monocytes Relative: 7.9 % (ref 3.0–12.0)
NEUTROS ABS: 5.5 10*3/uL (ref 1.4–7.7)
NEUTROS PCT: 55.2 % (ref 43.0–77.0)
PLATELETS: 298 10*3/uL (ref 150.0–400.0)
RBC: 5.01 Mil/uL (ref 3.87–5.11)
RDW: 14.6 % (ref 11.5–15.5)
WBC: 9.9 10*3/uL (ref 4.0–10.5)

## 2015-05-10 LAB — URINALYSIS, ROUTINE W REFLEX MICROSCOPIC
Bilirubin Urine: NEGATIVE
Ketones, ur: NEGATIVE
NITRITE: NEGATIVE
SPECIFIC GRAVITY, URINE: 1.02 (ref 1.000–1.030)
TOTAL PROTEIN, URINE-UPE24: NEGATIVE
Urine Glucose: NEGATIVE
Urobilinogen, UA: 0.2 (ref 0.0–1.0)
pH: 6 (ref 5.0–8.0)

## 2015-05-10 LAB — SEDIMENTATION RATE: SED RATE: 30 mm/h — AB (ref 0–22)

## 2015-05-10 MED ORDER — METHYLPREDNISOLONE ACETATE 80 MG/ML IJ SUSP
120.0000 mg | Freq: Once | INTRAMUSCULAR | Status: AC
  Administered 2015-05-10: 120 mg via INTRAMUSCULAR

## 2015-05-10 NOTE — Patient Instructions (Signed)
Please remember to go to the lab and x-ray department downstairs for your tests - we will call you with the results when they are available.    You can have one more depomedrol 120 mg IM shot if you want until you see Dr [facility]  Please schedule a follow up office visit in 4 weeks, sooner if needed

## 2015-05-10 NOTE — Progress Notes (Signed)
Subjective:    Patient ID: Margaret Diaz, female    DOB: 1966/01/17    MRN: [id number]    Brief patient profile:  49 yowf quit smoking in 2013 s sequelae but bad arthritis/ followed by [facility]/ fall of 2015 developed cough/sob/ R cp with CT pos R effusion so referred to pulmonary clinic 04/10/2015  By EDP at [facility].    History of Present Illness  04/10/2015 1st  Pulmonary office visit/ Wert   Chief Complaint  Patient presents with  . Pulmonary Consult    Self referral. Pt c/o SOB and CP "for quite some time"- worse x 2 wks. She states pain is worse when she takes a deep breath. She gets SOB with talking and walking "not too far". She also c/o cough- occ prod with grey sputum.   8 m  prior to OV  Onset bilateral R> L lower cp  hurt to deep breath with worse than usual arthritis  Then gradually worse sob and more localization of pain on R - "nothing helps" rec U/s thoracentesis    04/11/15 R thoracetesis x 520 cc exudate/glucose nl  Cyt > ACUTE INFLAMMATION AND EOSINOPHILS,    04/26/2015 f/u ov/Wert re: probable parapneumonic effusion   Chief Complaint  Patient presents with  . Follow-up    Pt states breathing is unchanged. Pt c/o SOB and wheezing. Occasional prod cough with clear thick mucus during early morning.   arthritis also worse than usual / min pain R chest with deep breath rec Omeprazole 20 Take 30- 60 min before your first and last meals of the day  Depomedrol 120 mg IM today  Please remember to go to the lab   department downstairs for your tests - we will call you with the results when they are available. Please schedule a follow up office visit in 2 weeks, sooner if needed with cxr on return    . 05/10/2015 f/u ov/Wert re:  Chief Complaint  Patient presents with  . Follow-up    Pt states her breathing has been worse since last visit.  She has still noticed wheezing and her cough is unhchanged.    Shot helped transiently better breathing/ could vacuum a  room or two p depomedrol  / also helped arthritis    No obvious day to day or daytime variabilty or assoc   cp or chest tightness, subjective wheeze overt sinus or hb symptoms. No unusual exp hx or h/o childhood pna/ asthma or knowledge of premature birth.  Sleeping ok without nocturnal  or early am exacerbation  of respiratory  c/o's or need for noct saba. Also denies any obvious fluctuation of symptoms with weather or environmental changes or other aggravating or alleviating factors except as outlined above   Current Medications, Allergies, Complete Past Medical History, Past Surgical History, Family History, and Social History were reviewed in [employer] record.  ROS  The following are not active complaints unless bolded sore throat, dysphagia, dental problems, itching, sneezing,  nasal congestion or excess/ purulent secretions, ear ache,   fever, chills, sweats, unintended wt loss,   exertional cp, hemoptysis,  orthopnea pnd or leg swelling, presyncope, palpitations, abdominal pain, anorexia, nausea, vomiting, diarrhea  or change in bowel or urinary habits, change in stools or urine, dysuria,hematuria,  rash, arthralgias, visual complaints, headache, numbness weakness or ataxia or problems with walking or coordination,  change in mood/affect or memory.  Objective:   Physical Exam  amb wf nad     04/26/2015          186 >  05/10/2015 184  Wt Readings from Last 3 Encounters:  04/10/15 183 lb (83.008 kg)  03/31/15 170 lb (77.111 kg)  03/30/15 175 lb (79.379 kg)    Vital signs reviewed  HEENT: nl dentition, turbinates, and orophanx. Nl external ear canals without cough reflex   NECK :  without JVD/Nodes/TM/ nl carotid upstrokes bilaterally   LUNGS: no acc muscle use,   clear bilaterally    CV:  RRR  no s3 or murmur or increase in P2, no edema   ABD:  soft and nontender with nl excursion in the supine position. No bruits or  organomegaly, bowel sounds nl  MS:  warm without deformities, calf tenderness, cyanosis or clubbing  SKIN: warm and dry without lesions    NEURO:  alert, approp, no deficits      I personally reviewed images and agree with radiology impression as follows:  cxr 05/10/15  The heart size and mediastinal contours are within normal limits. Both lungs are clear. No pneumothorax or pleural effusion is noted. The visualized skeletal structures are unremarkable  Lab Results  Component Value Date   ESRSEDRATE 30* 05/10/2015   Lab Results  Component Value Date   WBC 9.9 05/10/2015   HGB 14.0 05/10/2015   HCT 42.0 05/10/2015   MCV 83.9 05/10/2015   PLT 298.0 05/10/2015       eos 0.3  Assessment & Plan:   Outpatient Encounter Prescriptions as of 05/10/2015  Medication Sig  . Diazepam (VALIUM PO) Take 2 tablets by mouth at bedtime.  . diphenhydrAMINE (BENADRYL) 25 MG tablet Take 25 mg by mouth every 6 (six) hours as needed.  . gabapentin (NEURONTIN) 100 MG capsule Take 3 capsules by mouth 3 (three) times daily.  Marland Kitchen. omeprazole (PRILOSEC) 40 MG capsule Take 40 mg by mouth 2 (two) times daily.  Marland Kitchen. oxycodone (OXY-IR) 5 MG capsule Take 5 mg by mouth every 6 (six) hours as needed.  Marland Kitchen. QUEtiapine (SEROQUEL) 25 MG tablet Take 50 mg by mouth at bedtime.  . sucralfate (CARAFATE) 1 G tablet Take 1 tablet by mouth daily.  Marland Kitchen. venlafaxine (EFFEXOR) 37.5 MG tablet 3 tablets every am  . [EXPIRED] methylPREDNISolone acetate (DEPO-MEDROL) injection 120 mg    No facility-administered encounter medications on file as of 05/10/2015.

## 2015-05-11 ENCOUNTER — Telehealth: Payer: Self-pay | Admitting: Internal Medicine

## 2015-05-11 MED ORDER — SULFAMETHOXAZOLE-TRIMETHOPRIM 800-160 MG PO TABS: 1.0000 | ORAL_TABLET | Freq: Every day | ORAL | Status: DC

## 2015-05-11 NOTE — Progress Notes (Signed)
Quick Note:  LVM for pt to return call ______ 

## 2015-05-11 NOTE — Telephone Encounter (Signed)
  Notes Recorded by Nyoka Cowden, MD on 05/10/2015 at 8:06 PM Call pt: Reviewed cxr and no acute change so no change in recommendations made at [facility]  Notes Recorded by Nyoka Cowden, MD on 05/10/2015 at 8:09 PM Call patient : Studies are unremarkable, no change in recs - very little evidence to suggest uti but could have cystitis - can all in bactrim ds x 3 pills to take with a meal or check with primary  Pt aware of results and rec's per MW. Requests that Bactrim be sent to [facility] in [place], [place]

## 2015-05-15 ENCOUNTER — Encounter: Payer: Self-pay | Admitting: Internal Medicine

## 2015-05-15 NOTE — Assessment & Plan Note (Addendum)
-  R thoracentesis 04/11/2015  550 cc > exudate with nl glucose ACUTE INFLAMMATION AND EOSINOPHILS. - ESR 05/10/2015 =30/ no significant peripheral eos    This problem has resolved and was either parapneumonic or related to underlying collagen vasc dz as the finding of eosinophilia is completely non-specific  rec return to rheum/ f/u pulmonary 4 weeks to be sure does not recur p one more depomedrol 120 mg IM today at pt request pending rheum eval

## 2015-05-15 NOTE — Assessment & Plan Note (Signed)
05/10/2015  Walked RA x 3 laps @ 185 ft each stopped due to  End of study, nl pace, no sob or desat  > dizzy at end   No further w/u needed

## 2015-05-15 NOTE — Assessment & Plan Note (Signed)
Ua ordered

## 2015-06-07 ENCOUNTER — Ambulatory Visit: Payer: Self-pay | Admitting: Internal Medicine

## 2015-06-20 ENCOUNTER — Ambulatory Visit: Payer: Self-pay | Admitting: Physical Therapy

## 2015-06-30 ENCOUNTER — Encounter: Payer: Self-pay | Admitting: Emergency Medicine

## 2015-06-30 ENCOUNTER — Emergency Department
Admission: EM | Admit: 2015-06-30 | Discharge: 2015-06-30 | Disposition: A | Discharge status: Home / Self Care | Payer: Self-pay | Attending: Student | Admitting: Student

## 2015-06-30 ENCOUNTER — Emergency Department: Payer: Self-pay

## 2015-06-30 DIAGNOSIS — Y9289 Other specified places as the place of occurrence of the external cause: Secondary | ICD-10-CM | POA: Insufficient documentation

## 2015-06-30 DIAGNOSIS — Z87891 Personal history of nicotine dependence: Secondary | ICD-10-CM | POA: Insufficient documentation

## 2015-06-30 DIAGNOSIS — R109 Unspecified abdominal pain: Secondary | ICD-10-CM | POA: Insufficient documentation

## 2015-06-30 DIAGNOSIS — Y9389 Activity, other specified: Secondary | ICD-10-CM | POA: Insufficient documentation

## 2015-06-30 DIAGNOSIS — S2231XA Fracture of one rib, right side, initial encounter for closed fracture: Secondary | ICD-10-CM | POA: Insufficient documentation

## 2015-06-30 DIAGNOSIS — Z79899 Other long term (current) drug therapy: Secondary | ICD-10-CM | POA: Insufficient documentation

## 2015-06-30 DIAGNOSIS — Y998 Other external cause status: Secondary | ICD-10-CM | POA: Insufficient documentation

## 2015-06-30 DIAGNOSIS — X58XXXA Exposure to other specified factors, initial encounter: Secondary | ICD-10-CM | POA: Insufficient documentation

## 2015-06-30 DIAGNOSIS — Z792 Long term (current) use of antibiotics: Secondary | ICD-10-CM | POA: Insufficient documentation

## 2015-06-30 DIAGNOSIS — R112 Nausea with vomiting, unspecified: Secondary | ICD-10-CM | POA: Insufficient documentation

## 2015-06-30 HISTORY — DX: Unspecified osteoarthritis, unspecified site: M19.90

## 2015-06-30 LAB — COMPREHENSIVE METABOLIC PANEL
ALK PHOS: 132 U/L — AB (ref 38–126)
ALT: 90 U/L — AB (ref 14–54)
AST: 166 U/L — AB (ref 15–41)
Albumin: 3.7 g/dL (ref 3.5–5.0)
Anion gap: 9 (ref 5–15)
BILIRUBIN TOTAL: 0.3 mg/dL (ref 0.3–1.2)
BUN: 7 mg/dL (ref 6–20)
CALCIUM: 8.9 mg/dL (ref 8.9–10.3)
CO2: 28 mmol/L (ref 22–32)
CREATININE: 0.6 mg/dL (ref 0.44–1.00)
Chloride: 99 mmol/L — ABNORMAL LOW (ref 101–111)
Glucose, Bld: 85 mg/dL (ref 65–99)
Potassium: 3.8 mmol/L (ref 3.5–5.1)
Sodium: 136 mmol/L (ref 135–145)
Total Protein: 7.3 g/dL (ref 6.5–8.1)

## 2015-06-30 LAB — CBC WITH DIFFERENTIAL/PLATELET
BASOS ABS: 0.1 10*3/uL (ref 0–0.1)
Basophils Relative: 1 %
Eosinophils Absolute: 0.3 10*3/uL (ref 0–0.7)
Eosinophils Relative: 4 %
HEMATOCRIT: 39.2 % (ref 35.0–47.0)
HEMOGLOBIN: 12.8 g/dL (ref 12.0–16.0)
LYMPHS ABS: 2.5 10*3/uL (ref 1.0–3.6)
LYMPHS PCT: 28 %
MCH: 28.1 pg (ref 26.0–34.0)
MCHC: 32.7 g/dL (ref 32.0–36.0)
MCV: 86.1 fL (ref 80.0–100.0)
Monocytes Absolute: 0.8 10*3/uL (ref 0.2–0.9)
Monocytes Relative: 10 %
NEUTROS ABS: 5 10*3/uL (ref 1.4–6.5)
Neutrophils Relative %: 57 %
Platelets: 249 10*3/uL (ref 150–440)
RBC: 4.55 MIL/uL (ref 3.80–5.20)
RDW: 16.4 % — ABNORMAL HIGH (ref 11.5–14.5)
WBC: 8.7 10*3/uL (ref 3.6–11.0)

## 2015-06-30 LAB — URINALYSIS COMPLETE WITH MICROSCOPIC (ARMC ONLY)
BILIRUBIN URINE: NEGATIVE
Glucose, UA: NEGATIVE mg/dL
Hgb urine dipstick: NEGATIVE
Ketones, ur: NEGATIVE mg/dL
Leukocytes, UA: NEGATIVE
Nitrite: NEGATIVE
PROTEIN: NEGATIVE mg/dL
Specific Gravity, Urine: 1.006 (ref 1.005–1.030)
pH: 6 (ref 5.0–8.0)

## 2015-06-30 LAB — LIPASE, BLOOD: LIPASE: 14 U/L — AB (ref 22–51)

## 2015-06-30 MED ORDER — SODIUM CHLORIDE 0.9 % IV BOLUS (SEPSIS)
1000.0000 mL | Freq: Once | INTRAVENOUS | Status: AC
  Administered 2015-06-30: 1000 mL via INTRAVENOUS

## 2015-06-30 MED ORDER — ONDANSETRON 4 MG PO TBDP
4.0000 mg | ORAL_TABLET | Freq: Three times a day (TID) | ORAL | Status: DC | PRN
Start: 2015-06-30 — End: 2015-09-11

## 2015-06-30 MED ORDER — IOHEXOL 300 MG/ML  SOLN
100.0000 mL | Freq: Once | INTRAMUSCULAR | Status: AC | PRN
  Administered 2015-06-30: 100 mL via INTRAVENOUS

## 2015-06-30 MED ORDER — ONDANSETRON HCL 4 MG/2ML IJ SOLN
INTRAMUSCULAR | Status: AC
  Administered 2015-06-30: 4 mg via INTRAVENOUS
  Filled 2015-06-30: qty 2

## 2015-06-30 MED ORDER — MORPHINE SULFATE (PF) 4 MG/ML IV SOLN
INTRAVENOUS | Status: AC
  Administered 2015-06-30: 4 mg via INTRAVENOUS
  Filled 2015-06-30: qty 1

## 2015-06-30 MED ORDER — IOHEXOL 240 MG/ML SOLN
25.0000 mL | Freq: Once | INTRAMUSCULAR | Status: AC | PRN
  Administered 2015-06-30: 25 mL via ORAL

## 2015-06-30 MED ORDER — MORPHINE SULFATE (PF) 4 MG/ML IV SOLN
4.0000 mg | Freq: Once | INTRAVENOUS | Status: AC
  Administered 2015-06-30: 4 mg via INTRAVENOUS

## 2015-06-30 MED ORDER — OXYCODONE HCL 5 MG PO TABS: 5.0000 mg | ORAL_TABLET | Freq: Four times a day (QID) | ORAL | Status: DC | PRN

## 2015-06-30 MED ORDER — ONDANSETRON HCL 4 MG/2ML IJ SOLN
4.0000 mg | Freq: Once | INTRAMUSCULAR | Status: AC
  Administered 2015-06-30: 4 mg via INTRAVENOUS

## 2015-06-30 NOTE — ED Provider Notes (Signed)
[facility] Emergency Department Provider Note  ____________________________________________  Time seen: Approximately 4:41 PM  I have reviewed the triage vital signs and the nursing notes.   HISTORY  Chief Complaint Abdominal Pain    HPI Margaret Diaz is a 49 y.o. female with arthritis, history of pleural effusions, history of abdominal hysterectomy since for evaluation of abdominal pain. Patient reports 2 days of constant diffuse aching abdominal pain associated with nonbloody nonbilious emesis, no diarrhea. No fevers, no chest pain or difficulty breathing. She was seen at [facility] clinic today with x-rays that were concerning for possible bowel perforation however those x-rays are not available for review. Current severity of symptoms is moderate. No exacerbating factors. She reports she was vomiting 2 nights ago and fell into her bathtub when she was sitting on the toilet and leaning over the side of the bathtub to vomit. She denies any other trauma.   Past Medical History  Diagnosis Date  . Irritable bowel syndrome (IBS)   . Hx of bilateral breast implants   . Arthritis     Patient Active Problem List   Diagnosis Date Noted  . Dyspnea 05/10/2015  . Dysuria 05/10/2015  . Pleural effusion on right 04/10/2015    Past Surgical History  Procedure Laterality Date  . Abdominal hysterectomy    . Breast enhancement surgery  1999    Current Outpatient Rx  Name  Route  Sig  Dispense  Refill  . Diazepam (VALIUM PO)   Oral   Take 2 tablets by mouth at bedtime.         . diphenhydrAMINE (BENADRYL) 25 MG tablet   Oral   Take 25 mg by mouth every 6 (six) hours as needed.         . gabapentin (NEURONTIN) 100 MG capsule   Oral   Take 3 capsules by mouth 3 (three) times daily.      0   . omeprazole (PRILOSEC) 40 MG capsule   Oral   Take 40 mg by mouth 2 (two) times daily.         Marland Kitchen oxycodone (OXY-IR) 5 MG capsule   Oral   Take 5 mg by mouth  every 6 (six) hours as needed.         Marland Kitchen QUEtiapine (SEROQUEL) 25 MG tablet   Oral   Take 50 mg by mouth at bedtime.         . sucralfate (CARAFATE) 1 G tablet   Oral   Take 1 tablet by mouth daily.      1   . sulfamethoxazole-trimethoprim (BACTRIM DS) 800-160 MG per tablet   Oral   Take 1 tablet by mouth daily.   3 tablet   0   . venlafaxine (EFFEXOR) 37.5 MG tablet      3 tablets every am           Allergies Erythromycin and Hydromorphone hcl  History reviewed. No pertinent family history.  Social History Social History  Substance Use Topics  . Smoking status: Former Smoker -- 0.50 packs/day for 6 years    Types: Cigarettes    Quit date: 11/23/2011  . Smokeless tobacco: Never Used  . Alcohol Use: No    Review of Systems Constitutional: No fever/chills Eyes: No visual changes. ENT: No sore throat. Cardiovascular: Denies chest pain. Respiratory: Denies shortness of breath. Gastrointestinal: + abdominal pain.  + nausea, + vomiting.  No diarrhea.  No constipation. Genitourinary: Negative for dysuria. Musculoskeletal: Negative for back  pain. Skin: Negative for rash. Neurological: Negative for headaches, focal weakness or numbness.  10-point ROS otherwise negative.  ____________________________________________   PHYSICAL EXAM:  VITAL SIGNS: ED Triage Vitals  Enc Vitals Group     BP 06/30/15 1605 122/72 mmHg     Pulse Rate 06/30/15 1605 83     Resp 06/30/15 1605 20     Temp 06/30/15 1605 98.1 F (36.7 C)     Temp Source 06/30/15 1605 Oral     SpO2 06/30/15 1605 93 %     Weight 06/30/15 1605 190 lb (86.183 kg)     Height 06/30/15 1605 5\' 7"  (1.702 m)     Head Cir --      Peak Flow --      Pain Score 06/30/15 1606 8     Pain Loc --      Pain Edu? --      Excl. in GC? --     Constitutional: Alert and oriented. Sitting up in bed, in no distress until she moves at which point she has worsening abdominal pain. Eyes: Conjunctivae are normal.  PERRL. EOMI. Head: Atraumatic. Nose: No congestion/rhinnorhea. Mouth/Throat: Mucous membranes are moist.  Oropharynx non-erythematous. Neck: No stridor.  Cardiovascular: Normal rate, regular rhythm. Grossly normal heart sounds.  Good peripheral circulation. Respiratory: Normal respiratory effort.  No retractions. Lungs CTAB. Gastrointestinal: Normal bowel sounds, soft with diffuse abdominal tenderness, worse on the right.  No CVA tenderness. Genitourinary: deferred Musculoskeletal: No lower extremity tenderness nor edema.  No joint effusions. Neurologic:  Normal speech and language. No gross focal neurologic deficits are appreciated. No gait instability. Skin:  Skin is warm, dry and intact. No rash noted. Psychiatric: Mood and affect are normal. Speech and behavior are normal.  ____________________________________________   LABS (all labs ordered are listed, but only abnormal results are displayed)  Labs Reviewed  CBC WITH DIFFERENTIAL/PLATELET - Abnormal; Notable for the following:    RDW 16.4 (*)    All other components within normal limits  COMPREHENSIVE METABOLIC PANEL - Abnormal; Notable for the following:    Chloride 99 (*)    AST 166 (*)    ALT 90 (*)    Alkaline Phosphatase 132 (*)    All other components within normal limits  LIPASE, BLOOD - Abnormal; Notable for the following:    Lipase 14 (*)    All other components within normal limits  URINALYSIS COMPLETEWITH MICROSCOPIC ([facility] ONLY) - Abnormal; Notable for the following:    Color, Urine YELLOW (*)    APPearance CLEAR (*)    Bacteria, UA FEW (*)    Squamous Epithelial / LPF 0-5 (*)    All other components within normal limits   ____________________________________________  EKG  ED ECG REPORT I, Gayla Doss, the attending physician, personally viewed and interpreted this ECG.   Date: 06/30/2015  EKG Time: 17:00  Rate: 70  Rhythm: normal EKG, normal sinus rhythm  Axis: normal  Intervals:none  ST&T  Change: No acute ST change  ____________________________________________  RADIOLOGY  CT abdomen and pelvis IMPRESSION: 1. No acute findings within the abdomen or pelvis. 2. Small right pleural effusion and bibasilar atelectasis. 3. Cannot rule out a nondisplaced fracture involving the lateral aspect of the right eighth rib. Correlate for any focal tenderness in this region.  ____________________________________________   PROCEDURES  Procedure(s) performed: None  Critical Care performed: No  ____________________________________________   INITIAL IMPRESSION / ASSESSMENT AND PLAN / ED COURSE  Pertinent labs & imaging results that  were available during my care of the patient were reviewed by me and considered in my medical decision making (see chart for details).  Margaret Diaz is a 49 y.o. female with arthritis, history of pleural effusions, history of abdominal hysterectomy since for evaluation of abdominal pain. On exam. She has pain with movement/rotation at the abdomen. Vital signs stable, she is afebrile. She has diffuse abdominal tenderness but normal bowel sounds. Normal CBC. Mild elevations of her LFTs. Normal lipase. Urinalysis unremarkable. We'll control her pain. Awaiting CT abdomen and pelvis.  ----------------------------------------- 9:03 PM on 06/30/2015 ----------------------------------------- CT abdomen and pelvis shows no acute findings within the abdomen and pelvis to explain the patient's abdominal pain. There is no perforation. She has a small right pleural effusion which is chronic. Additionally she has a possible nondisplaced right eighth rib fracture and she does have tenderness in this region. Currently her pain is well controlled. She is satting 94-97% on room air. She is tolerating by mouth intake. We'll discharge with short course of oxycodone, incentive spirometer, Zofran, return precautions. She is comfortable with the discharge  plan.  ____________________________________________   FINAL CLINICAL IMPRESSION(S) / ED DIAGNOSES  Final diagnoses:  Abdominal pain, unspecified abdominal location  Right rib fracture, closed, initial encounter      Gayla Doss, MD 06/30/15 2105

## 2015-06-30 NOTE — ED Notes (Signed)
Patient to ED from [facility] with possible perforated bowel. Patient with abdominal pain and difficulty voiding since Tuesday.

## 2015-06-30 NOTE — ED Notes (Signed)
Incentive spirometry reviewed with pt using teach back method

## 2015-07-26 ENCOUNTER — Ambulatory Visit: Payer: Self-pay | Admitting: Pain Medicine

## 2015-07-27 ENCOUNTER — Ambulatory Visit: Payer: Self-pay | Admitting: Pain Medicine

## 2015-09-06 ENCOUNTER — Ambulatory Visit: Payer: Self-pay | Admitting: Pain Medicine

## 2015-09-11 ENCOUNTER — Encounter: Payer: Self-pay | Admitting: Pain Medicine

## 2015-09-11 ENCOUNTER — Other Ambulatory Visit: Payer: Self-pay | Admitting: Pain Medicine

## 2015-09-11 ENCOUNTER — Ambulatory Visit: Payer: Self-pay | Attending: Pain Medicine | Admitting: Pain Medicine

## 2015-09-11 VITALS — BP 143/80 | HR 65 | Temp 98.0°F | Resp 16 | Ht 67.0 in | Wt 185.0 lb

## 2015-09-11 DIAGNOSIS — M1990 Unspecified osteoarthritis, unspecified site: Secondary | ICD-10-CM | POA: Insufficient documentation

## 2015-09-11 DIAGNOSIS — F1190 Opioid use, unspecified, uncomplicated: Secondary | ICD-10-CM

## 2015-09-11 DIAGNOSIS — Z79899 Other long term (current) drug therapy: Secondary | ICD-10-CM

## 2015-09-11 DIAGNOSIS — G8929 Other chronic pain: Secondary | ICD-10-CM

## 2015-09-11 DIAGNOSIS — K589 Irritable bowel syndrome without diarrhea: Secondary | ICD-10-CM

## 2015-09-11 DIAGNOSIS — R52 Pain, unspecified: Secondary | ICD-10-CM

## 2015-09-11 DIAGNOSIS — Z9882 Breast implant status: Secondary | ICD-10-CM

## 2015-09-11 DIAGNOSIS — F1120 Opioid dependence, uncomplicated: Secondary | ICD-10-CM

## 2015-09-11 DIAGNOSIS — K639 Disease of intestine, unspecified: Secondary | ICD-10-CM | POA: Insufficient documentation

## 2015-09-11 DIAGNOSIS — Z87891 Personal history of nicotine dependence: Secondary | ICD-10-CM | POA: Insufficient documentation

## 2015-09-11 DIAGNOSIS — T402X5A Adverse effect of other opioids, initial encounter: Secondary | ICD-10-CM

## 2015-09-11 DIAGNOSIS — M792 Neuralgia and neuritis, unspecified: Secondary | ICD-10-CM

## 2015-09-11 DIAGNOSIS — K219 Gastro-esophageal reflux disease without esophagitis: Secondary | ICD-10-CM

## 2015-09-11 DIAGNOSIS — Z79891 Long term (current) use of opiate analgesic: Secondary | ICD-10-CM

## 2015-09-11 DIAGNOSIS — K279 Peptic ulcer, site unspecified, unspecified as acute or chronic, without hemorrhage or perforation: Secondary | ICD-10-CM

## 2015-09-11 DIAGNOSIS — K5903 Drug induced constipation: Secondary | ICD-10-CM | POA: Insufficient documentation

## 2015-09-11 DIAGNOSIS — M0760 Enteropathic arthropathies, unspecified site: Secondary | ICD-10-CM

## 2015-09-11 DIAGNOSIS — Z5181 Encounter for therapeutic drug level monitoring: Secondary | ICD-10-CM

## 2015-09-11 DIAGNOSIS — Z7189 Other specified counseling: Secondary | ICD-10-CM

## 2015-09-11 MED ORDER — GABAPENTIN 100 MG PO CAPS: 300.0000 mg | ORAL_CAPSULE | Freq: Three times a day (TID) | ORAL | Status: DC

## 2015-09-11 MED ORDER — LUBIPROSTONE 8 MCG PO CAPS: 8.0000 ug | ORAL_CAPSULE | Freq: Two times a day (BID) | ORAL | Status: DC

## 2015-09-11 MED ORDER — OXYCODONE HCL 5 MG PO CAPS: 5.0000 mg | ORAL_CAPSULE | Freq: Four times a day (QID) | ORAL | Status: DC | PRN

## 2015-09-11 MED ORDER — BENEFIBER PO POWD: ORAL | Status: DC

## 2015-09-11 MED ORDER — GABAPENTIN 300 MG PO CAPS: 300.0000 mg | ORAL_CAPSULE | Freq: Three times a day (TID) | ORAL | Status: DC

## 2015-09-11 NOTE — Progress Notes (Signed)
Safety precautions to be maintained throughout the outpatient stay will include: orient to surroundings, keep bed in low position, maintain call bell within reach at all times, provide assistance with transfer out of bed and ambulation.  09-11-15 Oxycodone 15 has 15 tabs remaining

## 2015-09-11 NOTE — Progress Notes (Signed)
Patient's Name: Margaret Diaz MRN: [id number] DOB: 1965/12/31 DOS: 09/11/2015  Primary Reason(s) for Visit: Encounter for Medication Management CC: Pain   HPI:   Margaret Diaz is a 49 y.o. year old, female patient, who returns today as an established patient. She has Pleural effusion on right; Dyspnea; Dysuria; Arthritis, degenerative; Chronic pain; Long term current use of opiate analgesic; Long term prescription opiate use; Opiate use; Opiate dependence (HCC); Encounter for therapeutic drug level monitoring; Encounter for chronic pain management; Therapeutic opioid-induced constipation (OIC); Neurogenic pain; Neuropathic pain; Generalized pain; Irritable bowel syndrome; History of bilateral breast implants; GERD (gastroesophageal reflux disease); Peptic ulcer disease; and Arthritis associated with inflammatory bowel disease on her problem list.. Her primarily concern today is the Pain     The patient comes in for medication management. She was recently seen 2 to abdominal pain. The patient indicates having no insurance and therefore she tries to avoid going to the hospital. Her primary concern is that of multi-joint pain.  Today's Pain Score: 5 . Clinically she looks like at 2/10. Reported level of pain is incompatible with clinical obrservations. This may be secondary to a possible lack of understanding on how the pain scale works. Pain Descriptors / Indicators: Aching, Burning Pain Frequency: Constant  Pharmacotherapy Review:   Side-effects or Adverse reactions: None reported. Effectiveness: Described as relatively effective, allowing for increase in activities of daily living (ADL). Onset of action: Within expected pharmacological parameters. Duration of action: Within normal limits for medication. Peak effect: Timing and results are as within normal expected parameters. [place] PMP: Compliant with practice rules and regulations. Medication Assessment Form: Reviewed. Patient indicates being  compliant with therapy Treatment compliance: Compliant. Substance Use Disorder (SUD) Risk Level: Low Pharmacologic Plan: Continue therapy as is.  Last Available Lab Work: Admission on 06/30/2015, Discharged on 06/30/2015  Component Date Value Ref Range Status  . WBC 06/30/2015 8.7  3.6 - 11.0 K/uL Final  . RBC 06/30/2015 4.55  3.80 - 5.20 MIL/uL Final  . Hemoglobin 06/30/2015 12.8  12.0 - 16.0 g/dL Final  . HCT 40/98/1191 39.2  35.0 - 47.0 % Final  . MCV 06/30/2015 86.1  80.0 - 100.0 fL Final  . MCH 06/30/2015 28.1  26.0 - 34.0 pg Final  . MCHC 06/30/2015 32.7  32.0 - 36.0 g/dL Final  . RDW 47/82/9562 16.4* 11.5 - 14.5 % Final  . Platelets 06/30/2015 249  150 - 440 K/uL Final  . Neutrophils Relative % 06/30/2015 57   Final  . Neutro Abs 06/30/2015 5.0  1.4 - 6.5 K/uL Final  . Lymphocytes Relative 06/30/2015 28   Final  . Lymphs Abs 06/30/2015 2.5  1.0 - 3.6 K/uL Final  . Monocytes Relative 06/30/2015 10   Final  . Monocytes Absolute 06/30/2015 0.8  0.2 - 0.9 K/uL Final  . Eosinophils Relative 06/30/2015 4   Final  . Eosinophils Absolute 06/30/2015 0.3  0 - 0.7 K/uL Final  . Basophils Relative 06/30/2015 1   Final  . Basophils Absolute 06/30/2015 0.1  0 - 0.1 K/uL Final  . Sodium 06/30/2015 136  135 - 145 mmol/L Final  . Potassium 06/30/2015 3.8  3.5 - 5.1 mmol/L Final  . Chloride 06/30/2015 99* 101 - 111 mmol/L Final  . CO2 06/30/2015 28  22 - 32 mmol/L Final  . Glucose, Bld 06/30/2015 85  65 - 99 mg/dL Final  . BUN 13/05/6577 7  6 - 20 mg/dL Final  . Creatinine, Ser 06/30/2015 0.60  0.44 - 1.00  mg/dL Final  . Calcium 16/10/960409/06/2015 8.9  8.9 - 10.3 mg/dL Final  . Total Protein 06/30/2015 7.3  6.5 - 8.1 g/dL Final  . Albumin 54/09/811909/06/2015 3.7  3.5 - 5.0 g/dL Final  . AST 14/78/295609/06/2015 166* 15 - 41 U/L Final  . ALT 06/30/2015 90* 14 - 54 U/L Final  . Alkaline Phosphatase 06/30/2015 132* 38 - 126 U/L Final  . Total Bilirubin 06/30/2015 0.3  0.3 - 1.2 mg/dL Final  . GFR calc non Af Amer  06/30/2015 >60  >60 mL/min Final  . GFR calc Af Amer 06/30/2015 >60  >60 mL/min Final  . Anion gap 06/30/2015 9  5 - 15 Final  . Lipase 06/30/2015 14* 22 - 51 U/L Final  . Color, Urine 06/30/2015 YELLOW* YELLOW Final  . APPearance 06/30/2015 CLEAR* CLEAR Final  . Glucose, UA 06/30/2015 NEGATIVE  NEGATIVE mg/dL Final  . Bilirubin Urine 06/30/2015 NEGATIVE  NEGATIVE Final  . Ketones, ur 06/30/2015 NEGATIVE  NEGATIVE mg/dL Final  . Specific Gravity, Urine 06/30/2015 1.006  1.005 - 1.030 Final  . Hgb urine dipstick 06/30/2015 NEGATIVE  NEGATIVE Final  . pH 06/30/2015 6.0  5.0 - 8.0 Final  . Protein, ur 06/30/2015 NEGATIVE  NEGATIVE mg/dL Final  . Nitrite 21/30/865709/06/2015 NEGATIVE  NEGATIVE Final  . Leukocytes, UA 06/30/2015 NEGATIVE  NEGATIVE Final  . RBC / HPF 06/30/2015 0-5  0 - 5 RBC/hpf Final  . WBC, UA 06/30/2015 0-5  0 - 5 WBC/hpf Final  . Bacteria, UA 06/30/2015 FEW* NONE SEEN Final  . Squamous Epithelial / LPF 06/30/2015 0-5* NONE SEEN Final  . Mucous 06/30/2015 PRESENT   Final   Allergies: Margaret Diaz is allergic to erythromycin and hydromorphone hcl.  Meds: The patient has a current medication list which includes the following prescription(s): bupropion, diazepam, diphenhydramine, omeprazole, sucralfate, gabapentin, lubiprostone, oxycodone, oxycodone, oxycodone, and benefiber. Requested Prescriptions   Signed Prescriptions Disp Refills  . oxycodone (OXY-IR) 5 MG capsule 120 capsule 0    Sig: Take 1 capsule (5 mg total) by mouth every 6 (six) hours as needed for pain.  Marland Kitchen. oxycodone (OXY-IR) 5 MG capsule 120 capsule 0    Sig: Take 1 capsule (5 mg total) by mouth every 6 (six) hours as needed for pain.  Marland Kitchen. oxycodone (OXY-IR) 5 MG capsule 120 capsule 0    Sig: Take 1 capsule (5 mg total) by mouth every 6 (six) hours as needed for pain.  Marland Kitchen. lubiprostone (AMITIZA) 8 MCG capsule 60 capsule PRN    Sig: Take 1 capsule (8 mcg total) by mouth 2 (two) times daily with a meal. Swallow the  medication whole. Do not break or chew the medication.  . Wheat Dextrin (BENEFIBER) POWD 500 g PRN    Sig: Stir 2 teaspoons of Benefiber into 4-8 oz of any non-carbonated beverage or soft food (hot or cold) TID  . gabapentin (NEURONTIN) 300 MG capsule 90 capsule 2    Sig: Take 1 capsule (300 mg total) by mouth every 8 (eight) hours.    ROS: Constitutional: Afebrile, no chills, well hydrated and well nourished Gastrointestinal: negative Musculoskeletal:negative Neurological: negative Behavioral/Psych: negative  PFSH: Medical:  Margaret Diaz  has a past medical history of Irritable bowel syndrome (IBS); bilateral breast implants; and Arthritis. Family: family history is not on file. Surgical:  has past surgical history that includes Abdominal hysterectomy and Breast enhancement surgery (1999). Tobacco:  reports that she quit smoking about 3 years ago. Her smoking use included Cigarettes. She has a  3 pack-year smoking history. She has never used smokeless tobacco. Alcohol:  reports that she does not drink alcohol. Drug:  reports that she does not use illicit drugs.  Physical Exam: Vitals:  Today's Vitals   09/11/15 1045  BP: 143/80  Pulse: 65  Temp: 98 F (36.7 C)  TempSrc: Oral  Resp: 16  Height:  (1.702 m)  Weight: 185 lb (83.915 kg)  SpO2: 99%  PainSc: 5   Calculated BMI: Body mass index is 28.97 kg/(m^2). General appearance: alert, cooperative, appears stated age and no distress Eyes: conjunctivae/corneas clear. PERRL, EOM's intact. Fundi benign. Lungs: No evidence respiratory distress, no audible rales or ronchi and no use of accessory muscles of respiration Neck: no adenopathy, no carotid bruit, no JVD, supple, symmetrical, trachea midline and thyroid not enlarged, symmetric, no tenderness/mass/nodules Back: symmetric, no curvature. ROM normal. No CVA tenderness. Extremities: extremities normal, atraumatic, no cyanosis or edema Pulses: 2+ and symmetric Skin: Skin  color, texture, turgor normal. No rashes or lesions Neurologic: Grossly normal    Assessment: Encounter Diagnosis:  Primary Diagnosis: Chronic pain [G89.29]  Plan: Lior was seen today for pain.  Diagnoses and all orders for this visit:  Chronic pain -     oxycodone (OXY-IR) 5 MG capsule; Take 1 capsule (5 mg total) by mouth every 6 (six) hours as needed for pain. -     oxycodone (OXY-IR) 5 MG capsule; Take 1 capsule (5 mg total) by mouth every 6 (six) hours as needed for pain. -     oxycodone (OXY-IR) 5 MG capsule; Take 1 capsule (5 mg total) by mouth every 6 (six) hours as needed for pain.  Long term current use of opiate analgesic -     Drugs of abuse screen w/o alc, rtn urine-sln  Long term prescription opiate use  Opiate use  Uncomplicated opioid dependence (HCC)  Encounter for therapeutic drug level monitoring  Encounter for chronic pain management  Therapeutic opioid-induced constipation (OIC) -     lubiprostone (AMITIZA) 8 MCG capsule; Take 1 capsule (8 mcg total) by mouth 2 (two) times daily with a meal. Swallow the medication whole. Do not break or chew the medication. -     Wheat Dextrin (BENEFIBER) POWD; Stir 2 teaspoons of Benefiber into 4-8 oz of any non-carbonated beverage or soft food (hot or cold) TID  Neurogenic pain -     Discontinue: gabapentin (NEURONTIN) 100 MG capsule; Take 3 capsules (300 mg total) by mouth every 8 (eight) hours. -     gabapentin (NEURONTIN) 300 MG capsule; Take 1 capsule (300 mg total) by mouth every 8 (eight) hours.  Neuropathic pain  Generalized pain  Irritable bowel syndrome, unspecified type  History of bilateral breast implants  Gastroesophageal reflux disease without esophagitis  Peptic ulcer disease  Arthritis associated with inflammatory bowel disease     There are no Patient Instructions on file for this visit. Medications discontinued today:  Medications Discontinued During This Encounter  Medication Reason   . ondansetron (ZOFRAN ODT) 4 MG disintegrating tablet Error  . QUEtiapine (SEROQUEL) 25 MG tablet Error  . sulfamethoxazole-trimethoprim (BACTRIM DS) 800-160 MG per tablet Error  . venlafaxine (EFFEXOR) 37.5 MG tablet Error  . oxycodone (OXY-IR) 5 MG capsule Duplicate  . oxyCODONE (ROXICODONE) 5 MG immediate release tablet Duplicate  . gabapentin (NEURONTIN) 100 MG capsule Reorder  . gabapentin (NEURONTIN) 100 MG capsule    Medications administered today:  Ms. Sobczak had no medications administered during this visit.  Primary Care  Physician: Jerl Mina, MD Location: [facility] Note by: Sydnee Levans Laban Emperor, M.D, DABA, DABAPM, DABPM, DABIPP, FIPP

## 2015-09-18 LAB — TOXASSURE SELECT 13 (MW), URINE: PDF: 0

## 2015-11-27 ENCOUNTER — Ambulatory Visit: Payer: Self-pay | Admitting: Physical Therapy

## 2015-12-06 ENCOUNTER — Encounter: Payer: Self-pay | Admitting: Pain Medicine

## 2015-12-06 ENCOUNTER — Ambulatory Visit: Payer: Self-pay | Attending: Pain Medicine | Admitting: Pain Medicine

## 2015-12-06 ENCOUNTER — Other Ambulatory Visit: Payer: Self-pay | Admitting: Pain Medicine

## 2015-12-06 VITALS — BP 137/87 | HR 83 | Temp 98.2°F | Resp 18 | Ht 66.0 in | Wt 187.0 lb

## 2015-12-06 DIAGNOSIS — K5903 Drug induced constipation: Secondary | ICD-10-CM | POA: Insufficient documentation

## 2015-12-06 DIAGNOSIS — M79673 Pain in unspecified foot: Secondary | ICD-10-CM

## 2015-12-06 DIAGNOSIS — M25561 Pain in right knee: Secondary | ICD-10-CM

## 2015-12-06 DIAGNOSIS — K589 Irritable bowel syndrome without diarrhea: Secondary | ICD-10-CM | POA: Insufficient documentation

## 2015-12-06 DIAGNOSIS — K219 Gastro-esophageal reflux disease without esophagitis: Secondary | ICD-10-CM | POA: Insufficient documentation

## 2015-12-06 DIAGNOSIS — Z5181 Encounter for therapeutic drug level monitoring: Secondary | ICD-10-CM

## 2015-12-06 DIAGNOSIS — Z79891 Long term (current) use of opiate analgesic: Secondary | ICD-10-CM

## 2015-12-06 DIAGNOSIS — Z87891 Personal history of nicotine dependence: Secondary | ICD-10-CM | POA: Insufficient documentation

## 2015-12-06 DIAGNOSIS — M79643 Pain in unspecified hand: Secondary | ICD-10-CM

## 2015-12-06 DIAGNOSIS — M25562 Pain in left knee: Secondary | ICD-10-CM

## 2015-12-06 DIAGNOSIS — M25559 Pain in unspecified hip: Secondary | ICD-10-CM | POA: Insufficient documentation

## 2015-12-06 DIAGNOSIS — Z9882 Breast implant status: Secondary | ICD-10-CM | POA: Insufficient documentation

## 2015-12-06 DIAGNOSIS — G8929 Other chronic pain: Secondary | ICD-10-CM | POA: Insufficient documentation

## 2015-12-06 DIAGNOSIS — M792 Neuralgia and neuritis, unspecified: Secondary | ICD-10-CM

## 2015-12-06 DIAGNOSIS — M722 Plantar fascial fibromatosis: Secondary | ICD-10-CM | POA: Insufficient documentation

## 2015-12-06 DIAGNOSIS — M545 Low back pain: Secondary | ICD-10-CM | POA: Insufficient documentation

## 2015-12-06 DIAGNOSIS — M542 Cervicalgia: Secondary | ICD-10-CM | POA: Insufficient documentation

## 2015-12-06 DIAGNOSIS — F1190 Opioid use, unspecified, uncomplicated: Secondary | ICD-10-CM | POA: Insufficient documentation

## 2015-12-06 DIAGNOSIS — R700 Elevated erythrocyte sedimentation rate: Secondary | ICD-10-CM

## 2015-12-06 DIAGNOSIS — M25549 Pain in joints of unspecified hand: Secondary | ICD-10-CM | POA: Insufficient documentation

## 2015-12-06 DIAGNOSIS — M25579 Pain in unspecified ankle and joints of unspecified foot: Secondary | ICD-10-CM | POA: Insufficient documentation

## 2015-12-06 DIAGNOSIS — J90 Pleural effusion, not elsewhere classified: Secondary | ICD-10-CM | POA: Insufficient documentation

## 2015-12-06 DIAGNOSIS — K279 Peptic ulcer, site unspecified, unspecified as acute or chronic, without hemorrhage or perforation: Secondary | ICD-10-CM | POA: Insufficient documentation

## 2015-12-06 LAB — COMPREHENSIVE METABOLIC PANEL
ALBUMIN: 4.3 g/dL (ref 3.5–5.0)
ALK PHOS: 105 U/L (ref 38–126)
ALT: 18 U/L (ref 14–54)
AST: 22 U/L (ref 15–41)
Anion gap: 9 (ref 5–15)
BILIRUBIN TOTAL: 0.4 mg/dL (ref 0.3–1.2)
BUN: 12 mg/dL (ref 6–20)
CO2: 22 mmol/L (ref 22–32)
CREATININE: 0.61 mg/dL (ref 0.44–1.00)
Calcium: 9 mg/dL (ref 8.9–10.3)
Chloride: 107 mmol/L (ref 101–111)
GFR calc Af Amer: >60 mL/min (ref >60)
GFR calc non Af Amer: >60 mL/min (ref >60)
GLUCOSE: 96 mg/dL (ref 65–99)
POTASSIUM: 3.9 mmol/L (ref 3.5–5.1)
Sodium: 138 mmol/L (ref 135–145)
TOTAL PROTEIN: 7.6 g/dL (ref 6.5–8.1)

## 2015-12-06 LAB — SEDIMENTATION RATE: Sed Rate: 23 mm/hr — ABNORMAL HIGH (ref 0–20)

## 2015-12-06 LAB — C-REACTIVE PROTEIN: CRP: <0.5 mg/dL (ref <1.0)

## 2015-12-06 LAB — MAGNESIUM: Magnesium: 2.1 mg/dL (ref 1.7–2.4)

## 2015-12-06 MED ORDER — GABAPENTIN 300 MG PO CAPS: 300.0000 mg | ORAL_CAPSULE | Freq: Three times a day (TID) | ORAL | Status: DC

## 2015-12-06 MED ORDER — OXYCODONE HCL 5 MG PO TABS: 5.0000 mg | ORAL_TABLET | Freq: Four times a day (QID) | ORAL | Status: DC | PRN

## 2015-12-06 NOTE — Patient Instructions (Addendum)
A prescription for OXYCODONE was given to you today.  A prescription for GABAPENTIN was sent to your pharmacy and should be available for pickup today.  Please get blood work done as soon as possible.  Follow gabapentin titration schedule.

## 2015-12-06 NOTE — Progress Notes (Signed)
Pill count: Oxycodone #12 Increased foot pain causing lack of exercise and weight gain

## 2015-12-06 NOTE — Progress Notes (Signed)
Patient's Name: Margaret Diaz MRN: [id number] DOB: 11-24-1965 DOS: 12/06/2015  Primary Reason(s) for Visit: Encounter for Medication Management CC: Joint Pain; Pain; Back Pain; and Neck Pain   HPI  Margaret Diaz is a 50 y.o. year old, female patient, who returns today as an established patient. She has Pleural effusion on right; Dyspnea; Dysuria; Arthritis, degenerative; Chronic pain; Long term current use of opiate analgesic; Long term prescription opiate use; Opiate use (30 MME/day); Opiate dependence (HCC); Encounter for therapeutic drug level monitoring; Encounter for chronic pain management; Therapeutic opioid-induced constipation (OIC); Neurogenic pain; Neuropathic pain; Generalized pain; Irritable bowel syndrome; History of bilateral breast implants; GERD (gastroesophageal reflux disease); Peptic ulcer disease; Arthritis associated with inflammatory bowel disease; Chronic feet pain (Location of Primary Source of Pain) (Bilateral) (R>L); Plantar fasciitis (Location of Primary Source of Pain) (Bilateral) (R>L); Chronic hip pain (Location of Secondary source of pain) (Bilateral) (L>R); Chronic knee pain (Location of Tertiary source of pain) (Bilateral) (L>R); Chronic neck pain (Right); Chronic low back pain (Bilateral) (L>R); and Chronic hand pain (Bilateral) (R>L) on her problem list.. Her primarily concern today is the Joint Pain; Pain; Back Pain; and Neck Pain   The patient comes in today clinics today for pharmacological management of her pain. The patient comes in today indicating that her primary source of pain is her feet with the right being worst on the left. This is followed by the hip pain where the left is worst in the right. Following is to knee pain with the left is also worst on the right. Following that one is the neck pain were its primarily just on the right side. She also complains of bilateral low back pain with the left being worst on the right and bilateral hand pain with the right  being worst on the left. In terms of her primary pain this appears to be secondary to a plantar fasciitis where she has had for longer than 2 years. This is being treated by the Margaret Diaz.  Reported Pain Score: 8 , clinically she looks like a 2-3/10. Reported level is inconsistent with clinical obrservations. Pain Type: Chronic pain Pain Location: Foot Pain Orientation: Right, Left Pain Descriptors / Indicators: Throbbing Pain Frequency: Constant  Date of Last Visit: 09/11/15 Service Provided on Last Visit: Med Refill  Controlled Substance Pharmacotherapy  Analgesic: Oxycodone IR 5 mg every 6 hours when necessary (total: 20 mg/day) MME/day: 30 mg/day Pharmacokinetics: Onset of action (Liberation/Absorption): Within expected pharmacological parameters. (20 minutes) Time to Peak effect (Distribution): Timing and results are as within normal expected parameters. (1 hour) Duration of action (Metabolism/Excretion): Within normal limits for medication. (2 hours) Pharmacodynamics: Analgesic Effect: 50-60% Activity Facilitation: Medication(s) allow patient to sit, stand, walk, and do the basic ADLs Perceived Effectiveness: Described as relatively effective, allowing for increase in activities of daily living (ADL) Side-effects or Adverse reactions: None reported Monitoring: [place] PMP: Compliant with practice rules and regulations UDS Results/interpretation: The patient's last UDS was done on 09/11/2015. This was read as abnormal since it had the presence of undeclared tramadol. Results discussed in detail with the patient. Medication Assessment Form: Discrepancies found between patient's report and information collected Treatment compliance: Compliant Risk Assessment: Substance Use Disorder (SUD) Risk Level: Moderate  Pharmacologic Plan: Today we have provided the patient with only one month refill and we will be checking her urine drug screen test again to see if we continue  to have problems.  Lab Work: Illicit Drugs No results found for: THCU, COCAINSCRNUR,  PCPSCRNUR, MDMA, AMPHETMU, METHADONE, ETOH  Inflammation Markers Lab Results  Component Value Date   ESRSEDRATE 23* 12/06/2015   CRP <0.5 12/06/2015    Renal Function Lab Results  Component Value Date   BUN 12 12/06/2015   CREATININE 0.61 12/06/2015   GFRAA >60 12/06/2015   GFRNONAA >60 12/06/2015    Hepatic Function Lab Results  Component Value Date   AST 22 12/06/2015   ALT 18 12/06/2015   ALBUMIN 4.3 12/06/2015    Electrolytes Lab Results  Component Value Date   NA 138 12/06/2015   K 3.9 12/06/2015   CL 107 12/06/2015   CALCIUM 9.0 12/06/2015   MG 2.1 12/06/2015    Allergies  Margaret Diaz is allergic to erythromycin; hydromorphone hcl; and meperidine.  Meds  The patient has a current medication list which includes the following prescription(s): bupropion, conjugated estrogens, diazepam, diphenhydramine, gabapentin, lidocaine (anorectal), omeprazole, quetiapine, sucralfate, and oxycodone.  Current Outpatient Prescriptions on File Prior to Visit  Medication Sig  . buPROPion (WELLBUTRIN SR) 150 MG 12 hr tablet Take 150 mg by mouth 2 (two) times daily.  . Diazepam (VALIUM PO) Take 20 mg by mouth at bedtime.   . diphenhydrAMINE (BENADRYL) 25 MG tablet Take 25 mg by mouth every 6 (six) hours as needed.  Marland Kitchen omeprazole (PRILOSEC) 40 MG capsule Take 40 mg by mouth 2 (two) times daily.  . sucralfate (CARAFATE) 1 G tablet Take 1 tablet by mouth daily.   No current facility-administered medications on file prior to visit.    ROS  Constitutional: Afebrile, no chills, well hydrated and well nourished Gastrointestinal: negative Musculoskeletal:negative Neurological: negative Behavioral/Psych: negative  PFSH  Medical:  Margaret Diaz  has a past medical history of Irritable bowel syndrome (IBS); bilateral breast implants; Arthritis; and GERD (gastroesophageal reflux disease). Family:  family history includes Mental illness in her mother. Surgical:  has past surgical history that includes Abdominal hysterectomy and Breast enhancement surgery (1999). Tobacco:  reports that she quit smoking about 4 years ago. Her smoking use included Cigarettes. She has a 3 pack-year smoking history. She has never used smokeless tobacco. Alcohol:  reports that she does not drink alcohol. Drug:  reports that she does not use illicit drugs.  Physical Exam  Vitals:  Today's Vitals   12/06/15 0956 12/06/15 0958  BP: 137/87   Pulse: 83   Temp: 98.2 F (36.8 C)   TempSrc: Oral   Resp: 18   Height: 5\' 6"  (1.676 m)   Weight: 187 lb (84.823 kg)   SpO2: 96%   PainSc: 8  8   PainLoc: Foot     Calculated BMI: Body mass index is 30.2 kg/(m^2).  General appearance: alert, cooperative, appears stated age and no distress Eyes: PERLA Respiratory: No evidence respiratory distress, no audible rales or ronchi and no use of accessory muscles of respiration  Cervical Spine Inspection: Normal anatomy Alignment: Symetrical ROM: Adequate  Upper Extremities Inspection: No gross anomalies detected ROM: Adequate Sensory: Normal Motor: Unremarkable  Thoracic Spine Inspection: No gross anomalies detected Alignment: Symetrical ROM: Adequate  Lumbar Spine Inspection: No gross anomalies detected Alignment: Symetrical ROM: Adequate  Gait: WNL  Lower Extremities Inspection: No gross anomalies detected ROM: Adequate Sensory:  Normal Motor: Unremarkable  Assessment & Plan  Primary Diagnosis & Pertinent Problem List: The primary encounter diagnosis was Chronic pain. Diagnoses of Encounter for therapeutic drug level monitoring, Long term current use of opiate analgesic, Neurogenic pain, Pain, foot, chronic, unspecified laterality, Plantar fasciitis (Location of Primary  Source of Pain) (Bilateral) (R>L), Chronic hip pain, unspecified laterality, Chronic knee pain (Location of Tertiary source of  pain) (Bilateral) (L>R), Chronic neck pain (Right), Chronic low back pain (Bilateral) (L>R), and Chronic hand pain, unspecified laterality were also pertinent to this visit.  Visit Diagnosis: 1. Chronic pain   2. Encounter for therapeutic drug level monitoring   3. Long term current use of opiate analgesic   4. Neurogenic pain   5. Pain, foot, chronic, unspecified laterality   6. Plantar fasciitis (Location of Primary Source of Pain) (Bilateral) (R>L)   7. Chronic hip pain, unspecified laterality   8. Chronic knee pain (Location of Tertiary source of pain) (Bilateral) (L>R)   9. Chronic neck pain (Right)   10. Chronic low back pain (Bilateral) (L>R)   11. Chronic hand pain, unspecified laterality     Assessment: No problem-specific assessment & plan notes found for this encounter.   Plan of Care  Pharmacotherapy (Medications Ordered): Meds ordered this encounter  Medications  . oxyCODONE (OXY IR/ROXICODONE) 5 MG immediate release tablet    Sig: Take 1 tablet (5 mg total) by mouth every 6 (six) hours as needed for moderate pain or severe pain.    Dispense:  120 tablet    Refill:  0    Do not place this medication, or any other prescription from our practice, on "Automatic Refill". Patient may have prescription filled one day early if pharmacy is closed on scheduled refill date. Do not fill until: 12/10/15 To last until: 01/09/16  . DISCONTD: gabapentin (NEURONTIN) 300 MG capsule    Sig: Take 1 capsule (300 mg total) by mouth every 8 (eight) hours.    Dispense:  90 capsule    Refill:  0    Do not place this medication, or any other prescription from our practice, on "Automatic Refill". Patient may have prescription filled one day early if pharmacy is closed on scheduled refill date.  . gabapentin (NEURONTIN) 300 MG capsule    Sig: Take 1-3 capsules (300-900 mg total) by mouth every 8 (eight) hours.    Dispense:  240 capsule    Refill:  2    Do not place this medication, or any  other prescription from our practice, on "Automatic Refill". Patient may have prescription filled one day early if pharmacy is closed on scheduled refill date.    Lab-work & Procedure Ordered: Orders Placed This Encounter  Procedures  . Drugs of abuse screen w/o alc, rtn urine-sln    Volume: 10 ml(s). Minimum 3 ml of urine is needed. Document temperature of fresh sample. Indications: Long term (current) use of opiate analgesic (Z79.891) Test#: [id number] ([employer] Select-13)  . Comprehensive metabolic panel    Order Specific Question:  Has the patient fasted?    Answer:  No  . C-reactive protein  . Magnesium  . Sedimentation rate  . Vitamin B12    Indication: Bone Pain (M89.9)  . Vitamin D pnl(25-hydrxy+1,25-dihy)-bld    Imaging Ordered: None  Interventional Therapies: Scheduled: None at this time. PRN Procedures: None at this time.    Referral(s) or Consult(s): None at this time.  Medications administered during this visit: Ms. Winders had no medications administered during this visit.  Future Appointments Date Time Provider Department Center  01/03/2016 1:00 PM Delano Metz, MD [facility] None    Primary Care Physician: Jerl Mina, MD Location: [facility] Facility Note by: Sydnee Levans. Laban Emperor, M.D, DABA, DABAPM, DABPM, DABIPP, FIPP

## 2015-12-07 DIAGNOSIS — M545 Low back pain, unspecified: Secondary | ICD-10-CM | POA: Insufficient documentation

## 2015-12-07 DIAGNOSIS — M542 Cervicalgia: Secondary | ICD-10-CM

## 2015-12-07 DIAGNOSIS — M25561 Pain in right knee: Secondary | ICD-10-CM

## 2015-12-07 DIAGNOSIS — R700 Elevated erythrocyte sedimentation rate: Secondary | ICD-10-CM | POA: Insufficient documentation

## 2015-12-07 DIAGNOSIS — M79643 Pain in unspecified hand: Secondary | ICD-10-CM

## 2015-12-07 DIAGNOSIS — M25562 Pain in left knee: Secondary | ICD-10-CM

## 2015-12-07 DIAGNOSIS — G8929 Other chronic pain: Secondary | ICD-10-CM | POA: Insufficient documentation

## 2015-12-07 DIAGNOSIS — M722 Plantar fascial fibromatosis: Secondary | ICD-10-CM | POA: Insufficient documentation

## 2015-12-07 DIAGNOSIS — M25559 Pain in unspecified hip: Secondary | ICD-10-CM

## 2015-12-15 LAB — TOXASSURE SELECT 13 (MW), URINE: PDF: 0

## 2015-12-18 NOTE — Progress Notes (Signed)

## 2015-12-18 NOTE — Progress Notes (Signed)
Quick Note:  Lab results reviewed and found to be within normal limits. ______ 

## 2016-01-03 ENCOUNTER — Telehealth: Payer: Self-pay

## 2016-01-03 ENCOUNTER — Encounter: Payer: Self-pay | Admitting: Pain Medicine

## 2016-01-03 ENCOUNTER — Ambulatory Visit: Payer: Self-pay | Attending: Pain Medicine | Admitting: Pain Medicine

## 2016-01-03 VITALS — BP 112/79 | HR 87 | Temp 98.1°F | Resp 18 | Ht 66.0 in | Wt 180.0 lb

## 2016-01-03 DIAGNOSIS — M25561 Pain in right knee: Secondary | ICD-10-CM | POA: Insufficient documentation

## 2016-01-03 DIAGNOSIS — M25552 Pain in left hip: Secondary | ICD-10-CM | POA: Insufficient documentation

## 2016-01-03 DIAGNOSIS — M542 Cervicalgia: Secondary | ICD-10-CM | POA: Insufficient documentation

## 2016-01-03 DIAGNOSIS — Z90710 Acquired absence of both cervix and uterus: Secondary | ICD-10-CM | POA: Insufficient documentation

## 2016-01-03 DIAGNOSIS — M79641 Pain in right hand: Secondary | ICD-10-CM | POA: Insufficient documentation

## 2016-01-03 DIAGNOSIS — Z9882 Breast implant status: Secondary | ICD-10-CM | POA: Insufficient documentation

## 2016-01-03 DIAGNOSIS — M545 Low back pain: Secondary | ICD-10-CM | POA: Insufficient documentation

## 2016-01-03 DIAGNOSIS — K279 Peptic ulcer, site unspecified, unspecified as acute or chronic, without hemorrhage or perforation: Secondary | ICD-10-CM | POA: Insufficient documentation

## 2016-01-03 DIAGNOSIS — M25551 Pain in right hip: Secondary | ICD-10-CM | POA: Insufficient documentation

## 2016-01-03 DIAGNOSIS — M79671 Pain in right foot: Secondary | ICD-10-CM | POA: Insufficient documentation

## 2016-01-03 DIAGNOSIS — M79672 Pain in left foot: Secondary | ICD-10-CM | POA: Insufficient documentation

## 2016-01-03 DIAGNOSIS — M25562 Pain in left knee: Secondary | ICD-10-CM | POA: Insufficient documentation

## 2016-01-03 DIAGNOSIS — Z5181 Encounter for therapeutic drug level monitoring: Secondary | ICD-10-CM

## 2016-01-03 DIAGNOSIS — K219 Gastro-esophageal reflux disease without esophagitis: Secondary | ICD-10-CM | POA: Insufficient documentation

## 2016-01-03 DIAGNOSIS — M25571 Pain in right ankle and joints of right foot: Secondary | ICD-10-CM | POA: Insufficient documentation

## 2016-01-03 DIAGNOSIS — M722 Plantar fascial fibromatosis: Secondary | ICD-10-CM | POA: Insufficient documentation

## 2016-01-03 DIAGNOSIS — M79642 Pain in left hand: Secondary | ICD-10-CM | POA: Insufficient documentation

## 2016-01-03 DIAGNOSIS — K5903 Drug induced constipation: Secondary | ICD-10-CM | POA: Insufficient documentation

## 2016-01-03 DIAGNOSIS — R0600 Dyspnea, unspecified: Secondary | ICD-10-CM | POA: Insufficient documentation

## 2016-01-03 DIAGNOSIS — Z87891 Personal history of nicotine dependence: Secondary | ICD-10-CM | POA: Insufficient documentation

## 2016-01-03 DIAGNOSIS — G8929 Other chronic pain: Secondary | ICD-10-CM | POA: Insufficient documentation

## 2016-01-03 DIAGNOSIS — M1990 Unspecified osteoarthritis, unspecified site: Secondary | ICD-10-CM | POA: Insufficient documentation

## 2016-01-03 DIAGNOSIS — K589 Irritable bowel syndrome without diarrhea: Secondary | ICD-10-CM | POA: Insufficient documentation

## 2016-01-03 DIAGNOSIS — J90 Pleural effusion, not elsewhere classified: Secondary | ICD-10-CM | POA: Insufficient documentation

## 2016-01-03 DIAGNOSIS — Z79891 Long term (current) use of opiate analgesic: Secondary | ICD-10-CM | POA: Insufficient documentation

## 2016-01-03 MED ORDER — OXYCODONE HCL 5 MG PO TABS: 5.0000 mg | ORAL_TABLET | Freq: Four times a day (QID) | ORAL | Status: DC | PRN

## 2016-01-03 NOTE — Telephone Encounter (Signed)
Patient came in for an appointment at 1300 today.

## 2016-01-03 NOTE — Patient Instructions (Signed)
A prescription for oxycodone x 3 was given to you today. 

## 2016-01-03 NOTE — Progress Notes (Signed)
Patient's Name: Margaret Diaz MRN: [id number] DOB: Aug 19, 1966 DOS: 01/03/2016  Primary Reason(s) for Visit: Encounter for Medication Management CC: Foot Pain; Back Pain; Hip Pain; Joint Pain; and Neck Pain   HPI  Margaret Diaz is a 50 y.o. year old, female patient, who returns today as an established patient. She has Pleural effusion on right; Dyspnea; Dysuria; Arthritis, degenerative; Chronic pain; Long term current use of opiate analgesic; Long term prescription opiate use; Opiate use (30 MME/day); Opiate dependence (HCC); Encounter for therapeutic drug level monitoring; Encounter for chronic pain management; Therapeutic opioid-induced constipation (OIC); Neurogenic pain; Neuropathic pain; Generalized pain; Irritable bowel syndrome; History of bilateral breast implants; GERD (gastroesophageal reflux disease); Peptic ulcer disease; Arthritis associated with inflammatory bowel disease; Chronic feet pain (Location of Primary Source of Pain) (Bilateral) (R>L); Plantar fasciitis (Location of Primary Source of Pain) (Bilateral) (R>L); Chronic hip pain (Location of Secondary source of pain) (Bilateral) (L>R); Chronic knee pain (Location of Tertiary source of pain) (Bilateral) (L>R); Chronic neck pain (Right); Chronic low back pain (Bilateral) (L>R); Chronic hand pain (Bilateral) (R>L); and Elevated sedimentation rate on her problem list.. Her primarily concern today is the Foot Pain; Back Pain; Hip Pain; Joint Pain; and Neck Pain   The patient comes in today clinics today for pharmacological management of her chronic pain.  Pain Assessment: Self-Reported Pain Score: 7 , clinically she looks like a 3-4/10. Reported level is compatible with observation Pain Type: Chronic pain Pain Location: Foot Pain Orientation: Right, Left Pain Descriptors / Indicators: Throbbing Pain Frequency: Constant  Date of Last Visit: 12/06/15 Service Provided on Last Visit: Med Refill  Controlled Substance Pharmacotherapy  Assessment  Analgesic: Oxycodone IR 5 mg every 6 hours (20 mg/day) MME/day: 30 mg/day Pharmacokinetics: Onset of action (Liberation/Absorption): Within expected pharmacological parameters Time to Peak effect (Distribution): Timing and results are as within normal expected parameters Duration of action (Metabolism/Excretion): Within normal limits for medication Pharmacodynamics: Analgesic Effect: More than 50% Activity Facilitation: Medication(s) allow patient to sit, stand, walk, and do the basic ADLs Perceived Effectiveness: Described as relatively effective, allowing for increase in activities of daily living (ADL) Side-effects or Adverse reactions: None reported Monitoring: [place] PMP: Compliant with practice rules and regulations UDS Results/interpretation: Last UDS done on 12/06/2015 came back within normal limits with no unexpected results. It does still have evidence of the patient is taking diazepam since it does show its metabolites. In addition, the levels would suggest that the patient is taking quite a bit of it. The patient was informed of the CDC guidelines and was instructed to try to avoid a concomitant use of benzodiazepines and opioids. Medication Assessment Form: Reviewed. Patient indicates being compliant with therapy Treatment compliance: Compliant Risk Assessment: Aberrant Behavior: None observed today Substance Use Disorder (SUD) Risk Level: Low Opioid Risk Tool (ORT) Score: Total Score: 0 Low Risk for SUD (Score <3) Depression Scale Score: PHQ-2: PHQ-2 Total Score: 0 No depression (0) PHQ-9: PHQ-9 Total Score: 0 No depression (0-4)  Pharmacologic Plan: No change in therapy, at this time   Laboratory Workup  Last ED UDS: No results found for: THCU, COCAINSCRNUR, PCPSCRNUR, MDMA, AMPHETMU, METHADONE, ETOH  Inflammation Markers Lab Results  Component Value Date   ESRSEDRATE 23* 12/06/2015   CRP <0.5 12/06/2015    Renal Function Lab Results  Component Value  Date   BUN 12 12/06/2015   CREATININE 0.61 12/06/2015   GFRAA >60 12/06/2015   GFRNONAA >60 12/06/2015    Hepatic Function Lab Results  Component Value  Date   AST 22 12/06/2015   ALT 18 12/06/2015   ALBUMIN 4.3 12/06/2015    Electrolytes Lab Results  Component Value Date   NA 138 12/06/2015   K 3.9 12/06/2015   CL 107 12/06/2015   CALCIUM 9.0 12/06/2015   MG 2.1 12/06/2015    Allergies  Margaret Diaz is allergic to erythromycin; hydromorphone hcl; and meperidine.  Meds  The patient has a current medication list which includes the following prescription(s): bupropion, conjugated estrogens, diazepam, diphenhydramine, gabapentin, lidocaine (anorectal), omeprazole, oxycodone, quetiapine, sucralfate, oxycodone, and oxycodone.  Current Outpatient Prescriptions on File Prior to Visit  Medication Sig  . buPROPion (WELLBUTRIN SR) 150 MG 12 hr tablet Take 150 mg by mouth 2 (two) times daily.  Marland Kitchen conjugated estrogens (PREMARIN) vaginal cream   . Diazepam (VALIUM PO) Take 20 mg by mouth at bedtime.   . diphenhydrAMINE (BENADRYL) 25 MG tablet Take 25 mg by mouth every 6 (six) hours as needed.  . gabapentin (NEURONTIN) 300 MG capsule Take 1-3 capsules (300-900 mg total) by mouth every 8 (eight) hours.  . Lidocaine, Anorectal, 5 % CREA Apply topically.  Marland Kitchen omeprazole (PRILOSEC) 40 MG capsule Take 40 mg by mouth 2 (two) times daily.  . QUEtiapine (SEROQUEL) 100 MG tablet Take 100 mg by mouth 2 (two) times daily.  . sucralfate (CARAFATE) 1 G tablet Take 1 tablet by mouth daily.   No current facility-administered medications on file prior to visit.    ROS  Constitutional: Afebrile, no chills, well hydrated and well nourished Gastrointestinal: negative Musculoskeletal:negative Neurological: negative Behavioral/Psych: negative  PFSH  Medical:  Margaret Diaz  has a past medical history of Irritable bowel syndrome (IBS); bilateral breast implants; Arthritis; and GERD (gastroesophageal reflux  disease). Family: family history includes Mental illness in her mother. Surgical:  has past surgical history that includes Abdominal hysterectomy and Breast enhancement surgery (1999). Tobacco:  reports that she quit smoking about 4 years ago. Her smoking use included Cigarettes. She has a 3 pack-year smoking history. She has never used smokeless tobacco. Alcohol:  reports that she does not drink alcohol. Drug:  reports that she does not use illicit drugs.  Physical Exam  Vitals:  Today's Vitals   01/03/16 1317  BP: 112/79  Pulse: 87  Temp: 98.1 F (36.7 C)  TempSrc: Oral  Resp: 18  Height:  (1.676 m)  Weight: 180 lb (81.647 kg)  SpO2: 98%  PainSc: 7   PainLoc: Foot    Calculated BMI: Body mass index is 29.07 kg/(m^2). Overweight (25-29.9 kg/m2) - 20% higher incidence of chronic pain  General appearance: alert, cooperative, appears stated age and no distress. She does indicate having a gastrointestinal virus and therefore she is here today with a mask covering her mouth and nose. Eyes: PERLA Respiratory: No evidence respiratory distress, no audible rales or ronchi and no use of accessory muscles of respiration  Cervical Spine Inspection: Normal anatomy Alignment: Symetrical ROM: Adequate  Upper Extremities Inspection: No gross anomalies detected ROM: Adequate Sensory: Normal Motor: Unremarkable  Thoracic Spine Inspection: No gross anomalies detected Alignment: Symetrical ROM: Adequate  Lumbar Spine Inspection: No gross anomalies detected Alignment: Symetrical ROM: Adequate  Gait: WNL  Lower Extremities Inspection: No gross anomalies detected ROM: Adequate Sensory:  Normal Motor: Unremarkable  Assessment & Plan  Primary Diagnosis & Pertinent Problem List: The primary encounter diagnosis was Chronic pain. Diagnoses of Encounter for therapeutic drug level monitoring, Long term current use of opiate analgesic, Pain, foot, chronic, right, Chronic  hip pain,  left, and Chronic knee pain (Location of Tertiary source of pain) (Bilateral) (L>R) were also pertinent to this visit.  Visit Diagnosis: 1. Chronic pain   2. Encounter for therapeutic drug level monitoring   3. Long term current use of opiate analgesic   4. Pain, foot, chronic, right   5. Chronic hip pain, left   6. Chronic knee pain (Location of Tertiary source of pain) (Bilateral) (L>R)     Problem-specific Plan(s): No problem-specific assessment & plan notes found for this encounter.   Plan of Care  Pharmacotherapy (Medications Ordered): Meds ordered this encounter  Medications  . oxyCODONE (OXY IR/ROXICODONE) 5 MG immediate release tablet    Sig: Take 1 tablet (5 mg total) by mouth every 6 (six) hours as needed for moderate pain or severe pain.    Dispense:  120 tablet    Refill:  0    Do not place this medication, or any other prescription from our practice, on "Automatic Refill". Patient may have prescription filled one day early if pharmacy is closed on scheduled refill date. Do not fill until: 01/09/16 To last until: 02/08/16  . oxyCODONE (OXY IR/ROXICODONE) 5 MG immediate release tablet    Sig: Take 1 tablet (5 mg total) by mouth every 6 (six) hours as needed for moderate pain or severe pain.    Dispense:  120 tablet    Refill:  0    Do not place this medication, or any other prescription from our practice, on "Automatic Refill". Patient may have prescription filled one day early if pharmacy is closed on scheduled refill date. Do not fill until: 02/08/16 To last until: 03/09/16  . oxyCODONE (OXY IR/ROXICODONE) 5 MG immediate release tablet    Sig: Take 1 tablet (5 mg total) by mouth every 6 (six) hours as needed for moderate pain or severe pain.    Dispense:  120 tablet    Refill:  0    Do not place this medication, or any other prescription from our practice, on "Automatic Refill". Patient may have prescription filled one day early if pharmacy is closed on scheduled  refill date. Do not fill until: 03/09/16 To last until: 04/08/16    [facility] Procedure Ordered: Orders Placed This Encounter  Procedures  . ToxASSURE Select 13 (MW), Urine    Volume: 30 ml(s). Minimum 3 ml of urine is needed. Document temperature of fresh sample. Indications: Long term (current) use of opiate analgesic (A54.098(Z79.891)    Imaging Ordered: None  Interventional Therapies: Scheduled: None at this time. PRN Procedures: None at this time.    Referral(s) or Consult(s): None at this time.  Medications administered during this visit: Margaret Diaz had no medications administered during this visit.  Future Appointments Date Time Provider Department Center  04/03/2016 1:00 PM Delano MetzFrancisco Aerin Delany, MD [facility] None    Primary Care Physician: Jerl MinaHEDRICK, JAMES, MD [facility] Note by: Sydnee LevansFrancisco A. Laban EmperorNaveira, M.D, DABA, DABAPM, DABPM, DABIPP, FIPP  Pain Score Disclaimer: We use the NRS-11 scale. This is a self-reported, subjective measurement of pain severity with only modest accuracy. It is used primarily to identify changes within a particular patient. It must be understood that outpatient pain scales are significantly less accurate that those used for research, where they can be applied under ideal controlled circumstances with minimal exposure to variables. In reality, the score is likely to be a combination of pain intensity and pain affect, where pain affect describes the degree of emotional  arousal or changes in action readiness caused by the sensory experience of pain. Factors such as social and work situation, setting, emotional state, anxiety levels, expectation, and prior pain experience may influence pain perception and show large inter-individual differences that may also be affected by time variables.

## 2016-01-03 NOTE — Progress Notes (Signed)
Pill count: Oxycodone 5 mg 20/120 filled 12/09/15 Had trouble getting the pill bottle top off and when it did come off, some pills went down the drain. Wearing a mask today due to having had nausea, vomiting, diarrhea and fever last week. Did not see PCP but did contact PCP for advice.

## 2016-01-03 NOTE — Telephone Encounter (Signed)
Pt has called twice in the last 30 minutes concerning her meds. Pt says she dropped pills down the since. Pt says she did not know how to work the top to the bottle of the pills. Pt says she is missing two days worth of pills. Pt has an appt scheduled today at 1 for a MR

## 2016-01-08 LAB — TOXASSURE SELECT 13 (MW), URINE: PDF: 0

## 2016-01-24 NOTE — Progress Notes (Signed)
Quick Note:  NOTE: This forensic urine drug screen (UDS) test was conducted using a state-of-the-art ultra high performance liquid chromatography and mass spectrometry system (UPLC/MS-MS), the most sophisticated and accurate method available. UPLC/MS-MS is 1,000 times more precise and accurate than standard gas chromatography and mass spectrometry (GC/MS). This system can analyze 26 drug categories and 180 drug compounds.  The results of this test came back with unexpected findings. Unreported Benzodiazepine. ______ 

## 2016-03-01 ENCOUNTER — Other Ambulatory Visit: Payer: Self-pay | Admitting: Pain Medicine

## 2016-03-05 ENCOUNTER — Telehealth: Payer: Self-pay | Admitting: *Deleted

## 2016-03-05 NOTE — Telephone Encounter (Signed)
Pt called stated that she is running out of the GABA PENTIN. She stated that she only have four days of these meds left. Please give the pt a call...thanks

## 2016-03-05 NOTE — Telephone Encounter (Signed)
Called patient back for detail of Gabapentin.  Patient states that she was in on 01/03/16 and was given Rx for oxycodone x 3 months but that Gabapentin was not refilled.  Patient had gone through a titration schedule and has increased her dosage to 900 mg q 6 hours.  States that there was conversation with Dr Laban EmperorNaveira to prescribe 900 mg tablets so "I don't have to swallow so many tablets".  Patient will be out of medicine on 03/09/16.  Since she did not get her refill in March can you escribe Rx?

## 2016-03-05 NOTE — Telephone Encounter (Signed)
Talked with patient re; Gabapentin and routed message to Dr Laban EmperorNaveira.

## 2016-03-06 ENCOUNTER — Telehealth: Payer: Self-pay | Admitting: *Deleted

## 2016-03-06 ENCOUNTER — Telehealth: Payer: Self-pay

## 2016-03-06 ENCOUNTER — Other Ambulatory Visit: Payer: Self-pay | Admitting: *Deleted

## 2016-03-06 MED ORDER — GABAPENTIN 800 MG PO TABS: 800.0000 mg | ORAL_TABLET | Freq: Four times a day (QID) | ORAL | Status: DC

## 2016-03-06 NOTE — Telephone Encounter (Signed)
Phone call made to patient that Dr Laban EmperorNaveira would refill medication, Gabapentin 800 mg 1 tablet 4 times daily to last her until her next appt on 04/03/16.

## 2016-03-06 NOTE — Telephone Encounter (Signed)
Please call pt concerning gabapentin. Pt is saying she needs a prescription

## 2016-04-03 ENCOUNTER — Ambulatory Visit: Payer: Self-pay | Attending: Pain Medicine | Admitting: Pain Medicine

## 2016-04-03 ENCOUNTER — Encounter: Payer: Self-pay | Admitting: Pain Medicine

## 2016-04-03 VITALS — BP 121/80 | HR 84 | Temp 98.2°F | Resp 16 | Ht 67.0 in | Wt 185.0 lb

## 2016-04-03 DIAGNOSIS — Z9882 Breast implant status: Secondary | ICD-10-CM | POA: Insufficient documentation

## 2016-04-03 DIAGNOSIS — M79673 Pain in unspecified foot: Secondary | ICD-10-CM | POA: Insufficient documentation

## 2016-04-03 DIAGNOSIS — J90 Pleural effusion, not elsewhere classified: Secondary | ICD-10-CM | POA: Insufficient documentation

## 2016-04-03 DIAGNOSIS — K5900 Constipation, unspecified: Secondary | ICD-10-CM | POA: Insufficient documentation

## 2016-04-03 DIAGNOSIS — T402X5A Adverse effect of other opioids, initial encounter: Secondary | ICD-10-CM | POA: Insufficient documentation

## 2016-04-03 DIAGNOSIS — R52 Pain, unspecified: Secondary | ICD-10-CM | POA: Insufficient documentation

## 2016-04-03 DIAGNOSIS — Z87891 Personal history of nicotine dependence: Secondary | ICD-10-CM | POA: Insufficient documentation

## 2016-04-03 DIAGNOSIS — M722 Plantar fascial fibromatosis: Secondary | ICD-10-CM | POA: Insufficient documentation

## 2016-04-03 DIAGNOSIS — K279 Peptic ulcer, site unspecified, unspecified as acute or chronic, without hemorrhage or perforation: Secondary | ICD-10-CM | POA: Insufficient documentation

## 2016-04-03 DIAGNOSIS — M542 Cervicalgia: Secondary | ICD-10-CM | POA: Insufficient documentation

## 2016-04-03 DIAGNOSIS — R700 Elevated erythrocyte sedimentation rate: Secondary | ICD-10-CM | POA: Insufficient documentation

## 2016-04-03 DIAGNOSIS — Z79899 Other long term (current) drug therapy: Secondary | ICD-10-CM

## 2016-04-03 DIAGNOSIS — G8929 Other chronic pain: Secondary | ICD-10-CM | POA: Insufficient documentation

## 2016-04-03 DIAGNOSIS — M25562 Pain in left knee: Secondary | ICD-10-CM | POA: Insufficient documentation

## 2016-04-03 DIAGNOSIS — M25561 Pain in right knee: Secondary | ICD-10-CM | POA: Insufficient documentation

## 2016-04-03 DIAGNOSIS — M545 Low back pain, unspecified: Secondary | ICD-10-CM

## 2016-04-03 DIAGNOSIS — F1190 Opioid use, unspecified, uncomplicated: Secondary | ICD-10-CM

## 2016-04-03 DIAGNOSIS — Z5181 Encounter for therapeutic drug level monitoring: Secondary | ICD-10-CM

## 2016-04-03 DIAGNOSIS — K589 Irritable bowel syndrome without diarrhea: Secondary | ICD-10-CM | POA: Insufficient documentation

## 2016-04-03 DIAGNOSIS — Z79891 Long term (current) use of opiate analgesic: Secondary | ICD-10-CM | POA: Insufficient documentation

## 2016-04-03 DIAGNOSIS — K219 Gastro-esophageal reflux disease without esophagitis: Secondary | ICD-10-CM | POA: Insufficient documentation

## 2016-04-03 DIAGNOSIS — M792 Neuralgia and neuritis, unspecified: Secondary | ICD-10-CM

## 2016-04-03 DIAGNOSIS — Z90710 Acquired absence of both cervix and uterus: Secondary | ICD-10-CM | POA: Insufficient documentation

## 2016-04-03 DIAGNOSIS — M25559 Pain in unspecified hip: Secondary | ICD-10-CM | POA: Insufficient documentation

## 2016-04-03 MED ORDER — OXYCODONE HCL 5 MG PO TABS: 5.0000 mg | ORAL_TABLET | Freq: Four times a day (QID) | ORAL | Status: DC | PRN

## 2016-04-03 MED ORDER — GABAPENTIN 800 MG PO TABS: 800.0000 mg | ORAL_TABLET | Freq: Four times a day (QID) | ORAL | Status: DC

## 2016-04-03 MED ORDER — GABAPENTIN 100 MG PO CAPS: 100.0000 mg | ORAL_CAPSULE | Freq: Four times a day (QID) | ORAL | Status: DC

## 2016-04-03 NOTE — Progress Notes (Signed)
Safety precautions to be maintained throughout the outpatient stay will include: orient to surroundings, keep bed in low position, maintain call bell within reach at all times, provide assistance with transfer out of bed and ambulation.  Pill count today #20 oxycodone 5mg , last filled 03-09-16.

## 2016-04-03 NOTE — Progress Notes (Signed)
Patient's Name: Margaret Diaz  Patient type: Established  MRN: [id number]  Service setting: Ambulatory outpatient  DOB: 10/16/1966  Location: [facility]  DOS: 04/03/2016  Primary Care Physician: Jerl Mina, MD  Note by: Sydnee Levans. Laban Emperor, M.D, DABA, DABAPM, DABPM, Olga Coaster, FIPP  Referring Physician: Jerl Mina, MD  Specialty: Board-Certified Interventional Pain Management  Last Visit to Pain Management: 03/06/2016   Primary Reason(s) for Visit: Encounter for prescription drug management (Level of risk: moderate) CC: Foot Pain; Knee Pain; Back Pain; Neck Pain; Hand Pain; and Hip Pain   HPI  Margaret Diaz is a 50 y.o. year old, female patient, who returns today as an established patient. She has Pleural effusion on right; Dyspnea; Dysuria; Arthritis, degenerative; Chronic pain; Long term current use of opiate analgesic; Long term prescription opiate use; Opiate use (30 MME/day); Opiate dependence (HCC); Encounter for therapeutic drug level monitoring; Encounter for chronic pain management; Opioid-induced constipation (OIC); Neurogenic pain; Neuropathic pain; Generalized pain; Irritable bowel syndrome; History of bilateral breast implants; GERD (gastroesophageal reflux disease); Peptic ulcer disease; Arthritis associated with inflammatory bowel disease; Chronic feet pain (Location of Primary Source of Pain) (Bilateral) (R>L); Plantar fasciitis (Location of Primary Source of Pain) (Bilateral) (R>L); Chronic hip pain (Location of Secondary source of pain) (Bilateral) (L>R); Chronic knee pain (Location of Tertiary source of pain) (Bilateral) (L>R); Chronic neck pain (Right); Chronic low back pain (Bilateral) (L>R); Chronic hand pain (Bilateral) (R>L); and Elevated sedimentation rate on her problem list.. Her primarily concern today is the Foot Pain; Knee Pain; Back Pain; Neck Pain; Hand Pain; and Hip Pain   Pain Assessment: Self-Reported Pain Score: 5 , clinically she looks  like a 2-3/10. Reported level is inconsistent with clinical obrservations Information on the proper use of the pain score provided to the patient today. Pain Type: Chronic pain Pain Location: Back Pain Orientation: Left, Right Pain Descriptors / Indicators: Constant, Burning, Nagging Pain Frequency: Constant  The patient comes into the clinics today for pharmacological management of her chronic pain. I last saw this patient on 03/01/2016. The patient  reports that she does not use illicit drugs. Her body mass index is 28.97 kg/(m^2).  Date of Last Visit: 01/03/16 Service Provided on Last Visit: Med Refill, Evaluation  Controlled Substance Pharmacotherapy Assessment & REMS (Risk Evaluation and Mitigation Strategy)  Analgesic: Oxycodone IR 5 mg every 6 hours (20 mg/day) Pill Count: #20 oxycodone 5mg , last filled 03-09-16. MME/day: 30 mg/day Pharmacokinetics: Onset of action (Liberation/Absorption): Within expected pharmacological parameters Time to Peak effect (Distribution): Timing and results are as within normal expected parameters Duration of action (Metabolism/Excretion): Within normal limits for medication Pharmacodynamics: Analgesic Effect: More than 50% Activity Facilitation: Medication(s) allow patient to sit, stand, walk, and do the basic ADLs Perceived Effectiveness: Described as relatively effective, allowing for increase in activities of daily living (ADL) Side-effects or Adverse reactions: None reported Monitoring: [place] PMP: Online review of the past 63-month period conducted. Compliant with practice rules and regulations Last UDS on record: TOXASSURE SELECT 13  Date Value Ref Range Status  01/03/2016 FINAL  Final    Comment:    ==================================================================== TOXASSURE SELECT 13 (MW) ==================================================================== Test                             Result       Flag       Units Drug Present and  Declared for Prescription Verification   Oxycodone  61           EXPECTED   ng/mg creat   Noroxycodone                   1649         EXPECTED   ng/mg creat    Sources of oxycodone include scheduled prescription medications.    Noroxycodone is an expected metabolite of oxycodone. Drug Present not Declared for Prescription Verification   Desmethyldiazepam              1110         UNEXPECTED ng/mg creat   Oxazepam                       >5051        UNEXPECTED ng/mg creat   Temazepam                      3970         UNEXPECTED ng/mg creat    Desmethyldiazepam, oxazepam, and temazepam are benzodiazepine    drugs, but may also be present as common metabolites of other    benzodiazepine drugs, including diazepam. ==================================================================== Test                      Result    Flag   Units      Ref Range   Creatinine              99               mg/dL      >=16>=20 ==================================================================== Declared Medications:  The flagging and interpretation on this report are based on the  following declared medications.  Unexpected results may arise from  inaccuracies in the declared medications.  **Note: The testing scope of this panel includes these medications:  Oxycodone  **Note: The testing scope of this panel does not include following  reported medications:  Bupropion (Wellbutrin)  Diphenhydramine (Benadryl)  Estrogen (Premarin)  Gabapentin (Neurontin)  Lidocaine  Omeprazole (Prilosec)  Quetiapine (Seroquel)  Sucralfate (Carafate) ==================================================================== For clinical consultation, please call [phone]. ====================================================================    UDS interpretation: Unexpected findings: Patient informed of the CDC guidelines and recommendations to stay away from the concomitant use of benzodiazepines and opioids  due to the increased risk of respiratory depression and death. Medication Assessment Form: Reviewed. Patient indicates being compliant with therapy Treatment compliance: Compliant Risk Assessment: Aberrant Behavior: None observed today Substance Use Disorder (SUD) Risk Level: Low-to-moderate Risk of opioid abuse or dependence: 0.7-3.0% with doses ? 36 MME/day and 6.1-26% with doses ? 120 MME/day. Opioid Risk Tool (ORT) Score: Total Score: 3 Low Risk for SUD (Score <3) Depression Scale Score: PHQ-2: PHQ-2 Total Score: 0 No depression (0) PHQ-9: PHQ-9 Total Score: 0 No depression (0-4)  Pharmacologic Plan: No change in therapy, at this time  Laboratory Chemistry  Inflammation Markers Lab Results  Component Value Date   ESRSEDRATE 23* 12/06/2015   CRP <0.5 12/06/2015    Renal Function Lab Results  Component Value Date   BUN 12 12/06/2015   CREATININE 0.61 12/06/2015   GFRAA >60 12/06/2015   GFRNONAA >60 12/06/2015    Hepatic Function Lab Results  Component Value Date   AST 22 12/06/2015   ALT 18 12/06/2015   ALBUMIN 4.3 12/06/2015    Electrolytes Lab Results  Component Value Date   NA 138 12/06/2015   K 3.9 12/06/2015  CL 107 12/06/2015   CALCIUM 9.0 12/06/2015   MG 2.1 12/06/2015    Pain Modulating Vitamins No results found for: VD25OH, MV784ON6EXB, MW4132GM0, NU2725DG6, VITAMINB12  Coagulation Parameters Lab Results  Component Value Date   PLT 249 06/30/2015    Note: Labs Reviewed.  Recent Diagnostic Imaging  Ct Abdomen Pelvis W Contrast  06/30/2015  CLINICAL DATA:  Possible bowel perforation. Abdominal pain in difficulty voiding EXAM: CT ABDOMEN AND PELVIS WITH CONTRAST TECHNIQUE: Multidetector CT imaging of the abdomen and pelvis was performed using the standard protocol following bolus administration of intravenous contrast. CONTRAST:  OMNIPAQUE IOHEXOL 300 MG/ML SOLN, 25mL OMNIPAQUE IOHEXOL 240 MG/ML SOLN COMPARISON:  03/30/2015 FINDINGS: Lower  chest: There is a small right pleural effusion identified. Subsegmental atelectasis is noted in the lung bases. Hepatobiliary: There is no suspicious liver abnormality. The gallbladder is normal. No biliary dilatation. Pancreas: Negative. Spleen: Negative. Adrenals/Urinary Tract: The adrenal glands are negative. Unremarkable appearance of the kidneys. The urinary bladder appears normal. Stomach/Bowel: The stomach is within normal limits. The small bowel loops have a normal course and caliber. No obstruction. Normal appearance of the colon. The appendix is visualized and appears normal. Vascular/Lymphatic: Calcified atherosclerotic disease involves the abdominal aorta. No aneurysm. No enlarged retroperitoneal or mesenteric adenopathy. No enlarged pelvic or inguinal lymph nodes. Reproductive: Previous hysterectomy.  There is no adnexal mass. Other: No free fluid or fluid collection within the abdomen or pelvis. There is no free intraperitoneal air identified. Musculoskeletal: Question nondisplaced right eighth rib fracture, image number 26 of series 7. Correlate for any focal lateral rib pain on the right side. IMPRESSION: 1. No acute findings within the abdomen or pelvis. 2. Small right pleural effusion and bibasilar atelectasis. 3. Cannot rule out a nondisplaced fracture involving the lateral aspect of the right eighth rib. Correlate for any focal tenderness in this region. Electronically Signed   By: Signa Kell M.D.   On: 06/30/2015 18:48   Cervical Imaging: Cervical MR wo contrast:  Results for orders placed in visit on 08/17/08  MR C Spine Ltd W/O Cm   Narrative * PRIOR REPORT IMPORTED FROM AN EXTERNAL SYSTEM *   PRIOR REPORT IMPORTED FROM THE [facility] WORKFLOW SYSTEM   REASON FOR EXAM:    neck pain with radiculopathy  COMMENTS:   PROCEDURE:     MR  - MR CERVICAL SPINE WO CONT  - Aug 17 2008  4:10PM   RESULT:     MRI cervical spine   Comparison: 12/15/2007   Indication: Pain   Technique:  Multiplanar and multisequence MR imaging of the cervical spine  was performed without contrast.   Findings:   The cervical cord is normal in size and signal. The cervical spine is  normal  in lordotic alignment, without listhesis. Bone marrow signal is normal.  Cerebellar tonsils are normal in position. Vertebral body heights are  maintained.   C2-3: No significant disc bulge, central canal stenosis, or foraminal  narrowing.   C3-4:  No significant disc bulge, central canal stenosis, or foraminal  narrowing.   C4-5: No significant disc bulge, central canal stenosis, or foraminal  narrowing.   C5-6:  Tiny central disc bulge. No significant mass-effect on the ventral  thecal sac. No central canal or foraminal narrowing.   C6-7:  No significant disc bulge, central canal stenosis, or foraminal  narrowing.   C7-T1:  No significant disc bulge, central canal stenosis, or foraminal  narrowing.   IMPRESSION:  1. No significant interval  change compared to the prior exam. There is a  tiny central disc bulge at C5-C6 without central canal or foraminal  narrowing.       Shoulder Imaging: Shoulder-L MR wo contrast:  Results for orders placed in visit on 08/17/08  MR Shoulder Left Wo Contrast   Narrative * PRIOR REPORT IMPORTED FROM AN EXTERNAL SYSTEM *   PRIOR REPORT IMPORTED FROM THE [facility] WORKFLOW SYSTEM   REASON FOR EXAM:    neck pain with radiculopathy  COMMENTS:   PROCEDURE:     MR  - MR SHOULDER LT  WO CONTRAST  - Aug 17 2008  5:08PM   RESULT:   HISTORY: Pain, radiculopathy.   COMPARISON STUDIES: None.   PROCEDURE AND FINDINGS: Acromioclavicular degenerative change with  subacromial spurring and downward sloping of the acromion is noted. The  glenoid labrum is intact. Mild deformity is noted of the middle  glenohumeral  ligament. This could be related to ligamentous injury. The supraspinatus,  subscapularis, infraspinatus and teres minor tendons and muscles are   intact.  The long head of the biceps tendon is intact.   IMPRESSION:   1. Acromioclavicular degenerative change with downward sloping acromion  and  subacromial spurring. No evidence of rotator cuff tear. Mild supraspinatus  tendinous impingement is noted. There is mild deformity of the middle  glenohumeral ligament. This may be from ligamentous injury. The glenoid  labrum is intact.   2. No other focal abnormalities are noted. No evidence of rotator cuff  tear.   The humeral head is intact.   Thank you for the opportunity to contribute to the care of your patient.       Reviewed.  Meds  The patient has a current medication list which includes the following prescription(s): bupropion, diazepam, diphenhydramine, gabapentin, lidocaine (anorectal), omeprazole, oxycodone, oxycodone, oxycodone, quetiapine, sucralfate, and gabapentin.  Current Outpatient Prescriptions on File Prior to Visit  Medication Sig  . buPROPion (WELLBUTRIN SR) 150 MG 12 hr tablet Take 150 mg by mouth 2 (two) times daily.  . Diazepam (VALIUM PO) Take 20 mg by mouth at bedtime.   . diphenhydrAMINE (BENADRYL) 25 MG tablet Take 25 mg by mouth every 6 (six) hours as needed.  . Lidocaine, Anorectal, 5 % CREA Apply topically. Reported on 04/03/2016  . omeprazole (PRILOSEC) 40 MG capsule Take 40 mg by mouth 2 (two) times daily.  . QUEtiapine (SEROQUEL) 100 MG tablet Take 100 mg by mouth 2 (two) times daily.  . sucralfate (CARAFATE) 1 G tablet Take 1 tablet by mouth daily.   No current facility-administered medications on file prior to visit.    ROS  Constitutional: Denies any fever or chills Gastrointestinal: No reported hemesis, hematochezia, vomiting, or acute GI distress Musculoskeletal: Denies any acute onset joint swelling, redness, loss of ROM, or weakness Neurological: No reported episodes of acute onset apraxia, aphasia, dysarthria, agnosia, amnesia, paralysis, loss of coordination, or loss of  consciousness  Allergies  Ms. Armentrout is allergic to erythromycin; hydromorphone hcl; and meperidine.  PFSH  Medical:  Ms. Glade  has a past medical history of Irritable bowel syndrome (IBS); bilateral breast implants; Arthritis; and GERD (gastroesophageal reflux disease). Family: family history includes Mental illness in her mother. Surgical:  has past surgical history that includes Abdominal hysterectomy and Breast enhancement surgery (1999). Tobacco:  reports that she quit smoking about 4 years ago. Her smoking use included Cigarettes. She has a 3 pack-year smoking history. She has never used smokeless tobacco. Alcohol:  reports that  she does not drink alcohol. Drug:  reports that she does not use illicit drugs.  Constitutional Exam  Vitals: Blood pressure 121/80, pulse 84, temperature 98.2 F (36.8 C), resp. rate 16, height 5\' 7"  (1.702 m), weight 185 lb (83.915 kg), last menstrual period 06/29/1998, SpO2 97 %. General appearance: Well nourished, well developed, and well hydrated. In no acute distress Calculated BMI/Body habitus: Body mass index is 28.97 kg/(m^2). (25-29.9 kg/m2) Overweight - 20% higher incidence of chronic pain Psych/Mental status: Alert and oriented x 3 (person, place, & time) Eyes: PERLA Respiratory: No evidence of acute respiratory distress  Cervical Spine Exam  Inspection: No masses, redness, or swelling Alignment: Symmetrical ROM: Functional: ROM is within functional limits [facility]) Stability: No instability detected Muscle strength & Tone: Functionally intact Sensory: Unimpaired Palpation: No complaints of tenderness  Upper Extremity (UE) Exam    Side: Right upper extremity  Side: Left upper extremity  Inspection: No masses, redness, swelling, or asymmetry  Inspection: No masses, redness, swelling, or asymmetry  ROM:  ROM:  Functional: ROM is within functional limits [facility])  Functional: ROM is within functional limits [facility])  Muscle strength & Tone:  Functionally intact  Muscle strength & Tone: Functionally intact  Sensory: Unimpaired  Sensory: Unimpaired  Palpation: Non-contributory  Palpation: Non-contributory   Thoracic Spine Exam  Inspection: No masses, redness, or swelling Alignment: Symmetrical ROM: Functional: ROM is within functional limits [facility]) Stability: No instability detected Sensory: Unimpaired Muscle strength & Tone: Functionally intact Palpation: No complaints of tenderness  Lumbar Spine Exam  Inspection: No masses, redness, or swelling Alignment: Symmetrical ROM: Functional: Decreased ROM Stability: No instability detected Muscle strength & Tone: Functionally intact Sensory: Unimpaired Palpation: Tender Provocative Tests: Lumbar Hyperextension and rotation test: Positive for a bilateral lumbar facet pain. Patrick's Maneuver: deferred  Gait & Posture Assessment  Ambulation: Unassisted Gait: Unaffected Posture: WNL  Lower Extremity Exam    Side: Right lower extremity  Side: Left lower extremity  Inspection: No masses, redness, swelling, or asymmetry ROM:  Inspection: No masses, redness, swelling, or asymmetry ROM:  Functional: Decreased ROM at the hip and knee   Functional: Decreased ROM at the hip and knee.   Muscle strength & Tone: Functionally intact  Muscle strength & Tone: Functionally intact  Sensory: Unimpaired  Sensory: Unimpaired  Palpation: Non-contributory  Palpation: Non-contributory   Assessment & Plan  Primary Diagnosis & Pertinent Problem List: The primary encounter diagnosis was Chronic pain. Diagnoses of Encounter for therapeutic drug level monitoring, Long term prescription opiate use, Opiate use (30 MME/day), Pain, foot, chronic, unspecified laterality, Chronic hip pain, unspecified laterality, Chronic knee pain (Location of Tertiary source of pain) (Bilateral) (L>R), Chronic low back pain (Bilateral) (L>R), Neurogenic pain, and Chronic neck pain (Right) were also pertinent to this  visit.  Visit Diagnosis: 1. Chronic pain   2. Encounter for therapeutic drug level monitoring   3. Long term prescription opiate use   4. Opiate use (30 MME/day)   5. Pain, foot, chronic, unspecified laterality   6. Chronic hip pain, unspecified laterality   7. Chronic knee pain (Location of Tertiary source of pain) (Bilateral) (L>R)   8. Chronic low back pain (Bilateral) (L>R)   9. Neurogenic pain   10. Chronic neck pain (Right)     Problems updated and reviewed during this visit: Problem  Opioid-induced constipation (OIC)    Problem-specific Plan(s): No problem-specific assessment & plan notes found for this encounter.  No new assessment & plan notes have been filed under this  hospital service since the last note was generated. Service: Pain Management   Plan of Care   Problem List Items Addressed This Visit      High   Chronic feet pain (Location of Primary Source of Pain) (Bilateral) (R>L) (Chronic)   Chronic hip pain (Location of Secondary source of pain) (Bilateral) (L>R) (Chronic)   Relevant Medications   oxyCODONE (OXY IR/ROXICODONE) 5 MG immediate release tablet   oxyCODONE (OXY IR/ROXICODONE) 5 MG immediate release tablet   oxyCODONE (OXY IR/ROXICODONE) 5 MG immediate release tablet   gabapentin (NEURONTIN) 800 MG tablet   gabapentin (NEURONTIN) 100 MG capsule   Other Relevant Orders   HIP INJECTION   Chronic knee pain (Location of Tertiary source of pain) (Bilateral) (L>R) (Chronic)   Relevant Medications   oxyCODONE (OXY IR/ROXICODONE) 5 MG immediate release tablet   oxyCODONE (OXY IR/ROXICODONE) 5 MG immediate release tablet   oxyCODONE (OXY IR/ROXICODONE) 5 MG immediate release tablet   gabapentin (NEURONTIN) 800 MG tablet   gabapentin (NEURONTIN) 100 MG capsule   Other Relevant Orders   KNEE INJECTION   GENICULAR NERVE BLOCK   Chronic low back pain (Bilateral) (L>R) (Chronic)   Relevant Medications   oxyCODONE (OXY IR/ROXICODONE) 5 MG immediate  release tablet   oxyCODONE (OXY IR/ROXICODONE) 5 MG immediate release tablet   oxyCODONE (OXY IR/ROXICODONE) 5 MG immediate release tablet   Other Relevant Orders   LUMBAR FACET(MEDIAL BRANCH NERVE BLOCK) MBNB   Chronic neck pain (Right) (Chronic)   Relevant Medications   oxyCODONE (OXY IR/ROXICODONE) 5 MG immediate release tablet   oxyCODONE (OXY IR/ROXICODONE) 5 MG immediate release tablet   oxyCODONE (OXY IR/ROXICODONE) 5 MG immediate release tablet   gabapentin (NEURONTIN) 800 MG tablet   gabapentin (NEURONTIN) 100 MG capsule   Other Relevant Orders   CERVICAL EPIDURAL STEROID INJECTION   CERVICAL FACET (MEDIAL BRANCH NERVE BLOCK)    Chronic pain - Primary (Chronic)   Relevant Medications   oxyCODONE (OXY IR/ROXICODONE) 5 MG immediate release tablet   oxyCODONE (OXY IR/ROXICODONE) 5 MG immediate release tablet   oxyCODONE (OXY IR/ROXICODONE) 5 MG immediate release tablet   gabapentin (NEURONTIN) 800 MG tablet   gabapentin (NEURONTIN) 100 MG capsule   Neurogenic pain (Chronic)   Relevant Medications   gabapentin (NEURONTIN) 800 MG tablet   gabapentin (NEURONTIN) 100 MG capsule     Medium   Encounter for therapeutic drug level monitoring   Long term prescription opiate use (Chronic)   Relevant Orders   ToxASSURE Select 13 (MW), Urine   Opiate use (30 MME/day) (Chronic)       Pharmacotherapy (Medications Ordered): Meds ordered this encounter  Medications  . oxyCODONE (OXY IR/ROXICODONE) 5 MG immediate release tablet    Sig: Take 1 tablet (5 mg total) by mouth every 6 (six) hours as needed for severe pain.    Dispense:  120 tablet    Refill:  0    Do not place this medication, or any other prescription from our practice, on "Automatic Refill". Patient may have prescription filled one day early if pharmacy is closed on scheduled refill date. Do not fill until: 04/08/16 To last until: 05/08/16  . oxyCODONE (OXY IR/ROXICODONE) 5 MG immediate release tablet    Sig: Take 1  tablet (5 mg total) by mouth every 6 (six) hours as needed for severe pain.    Dispense:  120 tablet    Refill:  0    Do not place this medication, or any other prescription from  our practice, on "Automatic Refill". Patient may have prescription filled one day early if pharmacy is closed on scheduled refill date. Do not fill until: 05/08/16 To last until: 06/07/16  . oxyCODONE (OXY IR/ROXICODONE) 5 MG immediate release tablet    Sig: Take 1 tablet (5 mg total) by mouth every 6 (six) hours as needed for severe pain.    Dispense:  120 tablet    Refill:  0    Do not place this medication, or any other prescription from our practice, on "Automatic Refill". Patient may have prescription filled one day early if pharmacy is closed on scheduled refill date. Do not fill until: 06/07/16 To last until: 07/07/16  . gabapentin (NEURONTIN) 800 MG tablet    Sig: Take 1 tablet (800 mg total) by mouth 4 (four) times daily.    Dispense:  120 tablet    Refill:  2    Do not add this medication to the electronic "Automatic Refill" notification system. Patient may have prescription filled one day early if pharmacy is closed on scheduled refill date.  . gabapentin (NEURONTIN) 100 MG capsule    Sig: Take 1 capsule (100 mg total) by mouth 4 (four) times daily.    Dispense:  120 capsule    Refill:  2    Do not place this medication, or any other prescription from our practice, on "Automatic Refill". Patient may have prescription filled one day early if pharmacy is closed on scheduled refill date.    Lab-work & Procedure Ordered: Orders Placed This Encounter  Procedures  . HIP INJECTION  . KNEE INJECTION  . GENICULAR NERVE BLOCK  . LUMBAR FACET(MEDIAL BRANCH NERVE BLOCK) MBNB  . CERVICAL EPIDURAL STEROID INJECTION  . CERVICAL FACET (MEDIAL BRANCH NERVE BLOCK)   . ToxASSURE Select 13 (MW), Urine    Imaging Ordered: None  Interventional Therapies: Scheduled:  None at this time.    Considering:    1. Diagnostic bilateral intra-articular hip joint injection under fluoroscopic guidance, with or without sedation.  2. Possible bilateral hip joint radiofrequency ablation under fluoroscopic guidance and IV sedation, the pending on the results of the diagnostic injection.  3. Diagnostic bilateral intra-articular knee joint injection, without fluoroscopy or IV sedation using steroids versus a series of 5 Hyalgan knee injections.  4. Diagnostic bilateral genicular nerve block under fluoroscopic guidance and IV sedation  5. Possible diagnostic bilateral genicular nerve radiofrequency ablation under fluoroscopic guidance and IV sedation, the pending on the results of the diagnostic injection.  6. Diagnostic bilateral lumbar facet block under fluoroscopic guidance and IV sedation.  7. Possible bilateral lumbar facet radiofrequency ablation under fluoroscopic guidance and IV sedation the pending on the results of the diagnostic injection.  8. Diagnostic right-sided cervical epidural steroid injection under fluoroscopic guidance, with or without sedation.  9. Diagnostic right-sided cervical facet block under fluoroscopic guidance and IV sedation.  10. Possible right-sided cervical facet radiofrequency ablation under fluoroscopic guidance and IV sedation the pending on the results of the diagnostic injection.    PRN Procedures:   1. Diagnostic bilateral intra-articular hip joint injection under fluoroscopic guidance, with or without sedation.  2. Diagnostic bilateral intra-articular knee joint injection, without fluoroscopy or IV sedation using steroids versus a series of 5 Hyalgan knee injections.  3. Diagnostic bilateral genicular nerve block under fluoroscopic guidance and IV sedation  4. Diagnostic bilateral lumbar facet block under fluoroscopic guidance and IV sedation.  5. Diagnostic right-sided cervical epidural steroid injection under fluoroscopic guidance, with or without  sedation.  6. Diagnostic  right-sided cervical facet block under fluoroscopic guidance and IV sedation.    Referral(s) or Consult(s): None at this time.  New Prescriptions   GABAPENTIN (NEURONTIN) 100 MG CAPSULE    Take 1 capsule (100 mg total) by mouth 4 (four) times daily.    Medications administered during this visit: Ms. Monica had no medications administered during this visit.  Requested PM Follow-up: Return in about 3 months (around 06/24/2016) for Procedure (PRN - Patient will call), Medication Management, (3-Mo).  Future Appointments Date Time Provider Department Center  06/26/2016 1:00 PM Delano Metz, MD [facility] None    Primary Care Physician: Jerl Mina, MD Location: [facility] Note by: Aston Lieske A. Laban Emperor, M.D, DABA, DABAPM, DABPM, DABIPP, FIPP  Pain Score Disclaimer: We use the NRS-11 scale. This is a self-reported, subjective measurement of pain severity with only modest accuracy. It is used primarily to identify changes within a particular patient. It must be understood that outpatient pain scales are significantly less accurate that those used for research, where they can be applied under ideal controlled circumstances with minimal exposure to variables. In reality, the score is likely to be a combination of pain intensity and pain affect, where pain affect describes the degree of emotional arousal or changes in action readiness caused by the sensory experience of pain. Factors such as social and work situation, setting, emotional state, anxiety levels, expectation, and prior pain experience may influence pain perception and show large inter-individual differences that may also be affected by time variables.  Patient instructions provided during this appointment: There are no Patient Instructions on file for this visit.

## 2016-04-11 LAB — TOXASSURE SELECT 13 (MW), URINE: PDF: 0

## 2016-04-11 NOTE — Telephone Encounter (Signed)

## 2016-06-26 ENCOUNTER — Encounter: Payer: Self-pay | Admitting: Pain Medicine

## 2016-06-26 ENCOUNTER — Ambulatory Visit: Payer: Self-pay | Attending: Pain Medicine | Admitting: Pain Medicine

## 2016-06-26 VITALS — BP 139/74 | HR 72 | Temp 98.1°F | Resp 16 | Ht 66.0 in | Wt 186.0 lb

## 2016-06-26 DIAGNOSIS — M545 Low back pain: Secondary | ICD-10-CM | POA: Insufficient documentation

## 2016-06-26 DIAGNOSIS — M792 Neuralgia and neuritis, unspecified: Secondary | ICD-10-CM

## 2016-06-26 DIAGNOSIS — K279 Peptic ulcer, site unspecified, unspecified as acute or chronic, without hemorrhage or perforation: Secondary | ICD-10-CM | POA: Insufficient documentation

## 2016-06-26 DIAGNOSIS — M25559 Pain in unspecified hip: Secondary | ICD-10-CM | POA: Insufficient documentation

## 2016-06-26 DIAGNOSIS — M79641 Pain in right hand: Secondary | ICD-10-CM | POA: Insufficient documentation

## 2016-06-26 DIAGNOSIS — M25551 Pain in right hip: Secondary | ICD-10-CM | POA: Insufficient documentation

## 2016-06-26 DIAGNOSIS — M25562 Pain in left knee: Secondary | ICD-10-CM | POA: Insufficient documentation

## 2016-06-26 DIAGNOSIS — R700 Elevated erythrocyte sedimentation rate: Secondary | ICD-10-CM | POA: Insufficient documentation

## 2016-06-26 DIAGNOSIS — F1190 Opioid use, unspecified, uncomplicated: Secondary | ICD-10-CM

## 2016-06-26 DIAGNOSIS — K5903 Drug induced constipation: Secondary | ICD-10-CM | POA: Insufficient documentation

## 2016-06-26 DIAGNOSIS — K589 Irritable bowel syndrome without diarrhea: Secondary | ICD-10-CM | POA: Insufficient documentation

## 2016-06-26 DIAGNOSIS — M79642 Pain in left hand: Secondary | ICD-10-CM | POA: Insufficient documentation

## 2016-06-26 DIAGNOSIS — M79673 Pain in unspecified foot: Secondary | ICD-10-CM

## 2016-06-26 DIAGNOSIS — G8929 Other chronic pain: Secondary | ICD-10-CM | POA: Insufficient documentation

## 2016-06-26 DIAGNOSIS — Z87891 Personal history of nicotine dependence: Secondary | ICD-10-CM | POA: Insufficient documentation

## 2016-06-26 DIAGNOSIS — M25552 Pain in left hip: Secondary | ICD-10-CM | POA: Insufficient documentation

## 2016-06-26 DIAGNOSIS — R300 Dysuria: Secondary | ICD-10-CM | POA: Insufficient documentation

## 2016-06-26 DIAGNOSIS — M25561 Pain in right knee: Secondary | ICD-10-CM | POA: Insufficient documentation

## 2016-06-26 DIAGNOSIS — Z90710 Acquired absence of both cervix and uterus: Secondary | ICD-10-CM | POA: Insufficient documentation

## 2016-06-26 DIAGNOSIS — M722 Plantar fascial fibromatosis: Secondary | ICD-10-CM | POA: Insufficient documentation

## 2016-06-26 DIAGNOSIS — Z79891 Long term (current) use of opiate analgesic: Secondary | ICD-10-CM | POA: Insufficient documentation

## 2016-06-26 DIAGNOSIS — Z5181 Encounter for therapeutic drug level monitoring: Secondary | ICD-10-CM

## 2016-06-26 DIAGNOSIS — M25572 Pain in left ankle and joints of left foot: Secondary | ICD-10-CM | POA: Insufficient documentation

## 2016-06-26 DIAGNOSIS — M25571 Pain in right ankle and joints of right foot: Secondary | ICD-10-CM | POA: Insufficient documentation

## 2016-06-26 DIAGNOSIS — Z9882 Breast implant status: Secondary | ICD-10-CM | POA: Insufficient documentation

## 2016-06-26 DIAGNOSIS — K219 Gastro-esophageal reflux disease without esophagitis: Secondary | ICD-10-CM | POA: Insufficient documentation

## 2016-06-26 DIAGNOSIS — M25579 Pain in unspecified ankle and joints of unspecified foot: Secondary | ICD-10-CM | POA: Insufficient documentation

## 2016-06-26 MED ORDER — GABAPENTIN 100 MG PO CAPS: 100.0000 mg | ORAL_CAPSULE | Freq: Four times a day (QID) | ORAL | 2 refills | Status: DC

## 2016-06-26 MED ORDER — GABAPENTIN 800 MG PO TABS: 800.0000 mg | ORAL_TABLET | Freq: Four times a day (QID) | ORAL | 2 refills | Status: DC

## 2016-06-26 MED ORDER — OXYCODONE HCL 5 MG PO TABS: 5.0000 mg | ORAL_TABLET | Freq: Four times a day (QID) | ORAL | 0 refills | Status: DC | PRN

## 2016-06-26 NOTE — Progress Notes (Signed)
Patient's Name: Margaret Diaz  MRN: [id number]  Referring Provider: Jerl Mina, MD  DOB: 1966/02/04  PCP: Jerl Mina, MD  DOS: 06/26/2016  Note by: Sydnee Levans. Laban Emperor, MD  Service setting: Ambulatory outpatient  Specialty: Interventional Pain Management  Location: [facility] (AMB) Pain Management Facility    Patient type: Established   Primary Reason(s) for Visit: Encounter for prescription drug management (Level of risk: moderate) CC: Neck Pain and Back Pain (lower)   HPI  Margaret Diaz is a 50 y.o. year old, female patient, who returns today as an established patient. She has Dysuria; Chronic pain; Long term current use of opiate analgesic; Long term prescription opiate use; Opiate use (30 MME/day); Opiate dependence (HCC); Encounter for therapeutic drug level monitoring; Encounter for chronic pain management; Opioid-induced constipation (OIC); Neurogenic pain; Neuropathic pain; Generalized pain; Irritable bowel syndrome; History of bilateral breast implants; GERD (gastroesophageal reflux disease); Peptic ulcer disease; Arthritis associated with inflammatory bowel disease; Chronic feet pain (Location of Primary Source of Pain) (Bilateral) (R>L); Plantar fasciitis (Location of Primary Source of Pain) (Bilateral) (R>L); Chronic hip pain (Location of Secondary source of pain) (Bilateral) (L>R); Chronic knee pain (Location of Tertiary source of pain) (Bilateral) (L>R); Chronic neck pain (Right); Chronic low back pain (Bilateral) (L>R); Chronic hand pain (Bilateral) (R>L); and Elevated sedimentation rate on her problem list.. Her primarily concern today is the Neck Pain and Back Pain (lower)   Pain Assessment: Self-Reported Pain Score: 5  Clinically the patient looks like a 2/10 Reported level is inconsistent with clinical obrservations Information on the proper use of the pain score provided to the patient today. Pain Type: Chronic pain Pain Location: Neck Pain Descriptors / Indicators: Aching, Dull,  Sharp Pain Frequency: Constant  The patient comes into the clinics today for pharmacological management of her chronic pain. I last saw this patient on 04/03/2016. The patient  reports that she does not use drugs. Her body mass index is 30.02 kg/m.  Date of Last Visit: 04/03/16 Service Provided on Last Visit: Med Refill  Controlled Substance Pharmacotherapy Assessment & REMS (Risk Evaluation and Mitigation Strategy)  Analgesic: Oxycodone IR 5 mg every 6 hours (20 mg/day) MME/day: 30 mg/day Pill Count: Oxycodone  5 mg #46 out of 120 remaining. Filled 06-07-16. (This should last another 11 days, until 07/07/2016). Count would suggest that the patient is using the medication correctly. Pharmacokinetics: Onset of action (Liberation/Absorption): Within expected pharmacological parameters Time to Peak effect (Distribution): Timing and results are as within normal expected parameters Duration of action (Metabolism/Excretion): Within normal limits for medication Pharmacodynamics: Analgesic Effect: More than 50% Activity Facilitation: Medication(s) allow patient to sit, stand, walk, and do the basic ADLs Perceived Effectiveness: Described as relatively effective, allowing for increase in activities of daily living (ADL) Side-effects or Adverse reactions: None reported Monitoring: [place] PMP: Online review of the past 77-month period conducted. Compliant with practice rules and regulations Last UDS on record: ToxAssure Select 13  Date Value Ref Range Status  04/03/2016 FINAL  Final    Comment:    ==================================================================== TOXASSURE SELECT 13 (MW) ==================================================================== Specimen Alert Note:  Urinary creatinine is low; ability to detect some drugs may be compromised.  Interpret results with caution. ==================================================================== Test                             Result        Flag       Units Drug Present and Declared for Prescription  Verification   Oxazepam                       4742         EXPECTED   ng/mg creat   Temazepam                      3350         EXPECTED   ng/mg creat    Oxazepam and temazepam are expected metabolites of diazepam.    Oxazepam is also an expected metabolite of other benzodiazepine    drugs, including chlordiazepoxide, prazepam, clorazepate,    halazepam, and temazepam.  Oxazepam and temazepam are available    as scheduled prescription medications.   Noroxycodone                   858          EXPECTED   ng/mg creat    Noroxycodone is an expected metabolite of oxycodone. Sources of    oxycodone include scheduled prescription medications. Drug Absent but Declared for Prescription Verification   Oxycodone                      Not Detected UNEXPECTED ng/mg creat    Oxycodone is almost always present in patients taking this drug    consistently.  Absence of oxycodone could be due to lapse of time    since the last dose or unusual pharmacokinetics (rapid    metabolism). ==================================================================== Test                      Result    Flag   Units      Ref Range   Creatinine              12        L      mg/dL      >=54 ==================================================================== Declared Medications:  The flagging and interpretation on this report are based on the  following declared medications.  Unexpected results may arise from  inaccuracies in the declared medications.  **Note: The testing scope of this panel includes these medications:  Diazepam  Oxycodone (Oxy-IR)  **Note: The testing scope of this panel does not include following  reported medications:  Bupropion (Wellbutrin)  Diphenhydramine (Benadryl)  Gabapentin (Neurontin)  Lidocaine  Omeprazole (Prilosec)  Quetiapine (Seroquel)  Sucralfate  (Carafate) ==================================================================== For clinical consultation, please call [phone]. ====================================================================    UDS interpretation: Compliant Absence of the parent compound in the presence of its metabolites could be due to lapse of time since the last dose or unusual pharmacokinetics (Rapid Metabolism). Medication Assessment Form: Reviewed. Patient indicates being compliant with therapy Treatment compliance: Compliant Risk Assessment: Aberrant Behavior: None observed today Substance Use Disorder (SUD) Risk Level: Low-to-moderate Risk of opioid abuse or dependence: 0.7-3.0% with doses ? 36 MME/day and 6.1-26% with doses ? 120 MME/day. Opioid Risk Tool (ORT) Score: Total Score: 3 Low Risk for SUD (Score <3) Depression Scale Score: PHQ-2: PHQ-2 Total Score: 0 No depression (0) PHQ-9: PHQ-9 Total Score: 0 No depression (0-4)  Pharmacologic Plan: No change in therapy, at this time  Laboratory Chemistry  Inflammation Markers Lab Results  Component Value Date   ESRSEDRATE 23 (H) 12/06/2015   CRP <0.5 12/06/2015    Renal Function Lab Results  Component Value Date   BUN 12 12/06/2015   CREATININE 0.61 12/06/2015   GFRAA >60  12/06/2015   GFRNONAA >60 12/06/2015    Hepatic Function Lab Results  Component Value Date   AST 22 12/06/2015   ALT 18 12/06/2015   ALBUMIN 4.3 12/06/2015    Electrolytes Lab Results  Component Value Date   NA 138 12/06/2015   K 3.9 12/06/2015   CL 107 12/06/2015   CALCIUM 9.0 12/06/2015   MG 2.1 12/06/2015    Pain Modulating Vitamins No results found for: Jerry CarasVD25OH, [id number], [id number], [id number], 25OHVITD1, 25OHVITD2, 25OHVITD3, VITAMINB12  Coagulation Parameters Lab Results  Component Value Date   PLT 249 06/30/2015    Cardiovascular Lab Results  Component Value Date   BNP 86 09/13/2014   HGB 12.8 06/30/2015   HCT 39.2 06/30/2015     Note: Lab results reviewed.  Recent Diagnostic Imaging  Ct Abdomen Pelvis W Contrast  Result Date: 06/30/2015 CLINICAL DATA:  Possible bowel perforation. Abdominal pain in difficulty voiding EXAM: CT ABDOMEN AND PELVIS WITH CONTRAST TECHNIQUE: Multidetector CT imaging of the abdomen and pelvis was performed using the standard protocol following bolus administration of intravenous contrast. CONTRAST:  100mL OMNIPAQUE IOHEXOL 300 MG/ML SOLN, 25mL OMNIPAQUE IOHEXOL 240 MG/ML SOLN COMPARISON:  03/30/2015 FINDINGS: Lower chest: There is a small right pleural effusion identified. Subsegmental atelectasis is noted in the lung bases. Hepatobiliary: There is no suspicious liver abnormality. The gallbladder is normal. No biliary dilatation. Pancreas: Negative. Spleen: Negative. Adrenals/Urinary Tract: The adrenal glands are negative. Unremarkable appearance of the kidneys. The urinary bladder appears normal. Stomach/Bowel: The stomach is within normal limits. The small bowel loops have a normal course and caliber. No obstruction. Normal appearance of the colon. The appendix is visualized and appears normal. Vascular/Lymphatic: Calcified atherosclerotic disease involves the abdominal aorta. No aneurysm. No enlarged retroperitoneal or mesenteric adenopathy. No enlarged pelvic or inguinal lymph nodes. Reproductive: Previous hysterectomy.  There is no adnexal mass. Other: No free fluid or fluid collection within the abdomen or pelvis. There is no free intraperitoneal air identified. Musculoskeletal: Question nondisplaced right eighth rib fracture, image number 26 of series 7. Correlate for any focal lateral rib pain on the right side. IMPRESSION: 1. No acute findings within the abdomen or pelvis. 2. Small right pleural effusion and bibasilar atelectasis. 3. Cannot rule out a nondisplaced fracture involving the lateral aspect of the right eighth rib. Correlate for any focal tenderness in this region. Electronically  Signed   By: Signa Kellaylor  Stroud M.D.   On: 06/30/2015 18:48    Meds  The patient has a current medication list which includes the following prescription(s): bupropion, diazepam, diphenhydramine, gabapentin, gabapentin, lidocaine (anorectal), omeprazole, oxycodone, oxycodone, oxycodone, oxycodone, quetiapine, sucralfate, and triamcinolone cream.  Current Outpatient Prescriptions on File Prior to Visit  Medication Sig  . buPROPion (WELLBUTRIN SR) 150 MG 12 hr tablet Take 150 mg by mouth 2 (two) times daily.  . Diazepam (VALIUM PO) Take 20 mg by mouth at bedtime.   . diphenhydrAMINE (BENADRYL) 25 MG tablet Take 25 mg by mouth every 6 (six) hours as needed.  Marland Kitchen. omeprazole (PRILOSEC) 40 MG capsule Take 40 mg by mouth 2 (two) times daily.  . QUEtiapine (SEROQUEL) 100 MG tablet Take 100 mg by mouth 2 (two) times daily.  . sucralfate (CARAFATE) 1 G tablet Take 1 tablet by mouth 2 (two) times daily.    No current facility-administered medications on file prior to visit.     ROS  Constitutional: Denies any fever or chills Gastrointestinal: No reported hemesis, hematochezia, vomiting, or acute GI distress Musculoskeletal: Denies any  acute onset joint swelling, redness, loss of ROM, or weakness Neurological: No reported episodes of acute onset apraxia, aphasia, dysarthria, agnosia, amnesia, paralysis, loss of coordination, or loss of consciousness  Allergies  Margaret Diaz is allergic to erythromycin; hydromorphone hcl; and meperidine.  PFSH  Medical:  Margaret Diaz  has a past medical history of Arthritis; Dyspnea (05/10/2015); GERD (gastroesophageal reflux disease); bilateral breast implants; Irritable bowel syndrome (IBS); and Pleural effusion on right (04/10/2015). Family: family history includes Mental illness in her mother. Surgical:  has a past surgical history that includes Abdominal hysterectomy and Breast enhancement surgery (1999). Tobacco:  reports that she quit smoking about 4 years ago. Her  smoking use included Cigarettes. She has a 3.00 pack-year smoking history. She has never used smokeless tobacco. Alcohol:  reports that she does not drink alcohol. Drug:  reports that she does not use drugs.  Constitutional Exam  Vitals: Blood pressure 139/74, pulse 72, temperature 98.1 F (36.7 C), temperature source Oral, resp. rate 16, height 5\' 6"  (1.676 m), weight 186 lb (84.4 kg), last menstrual period 06/29/1998, SpO2 98 %. General appearance: Well nourished, well developed, and well hydrated. In no acute distress Calculated BMI/Body habitus: Body mass index is 30.02 kg/m. (30-34.9 kg/m2) Obese (Class I) - 68% higher incidence of chronic pain Psych/Mental status: Alert and oriented x 3 (person, place, & time) Eyes: PERLA Respiratory: No evidence of acute respiratory distress  Cervical Spine Exam  Inspection: No masses, redness, or swelling Alignment: Symmetrical Functional ROM: ROM appears unrestricted Stability: No instability detected Muscle strength & Tone: Functionally intact Sensory: Unimpaired Palpation: Non-contributory  Upper Extremity (UE) Exam    Side: Right upper extremity  Side: Left upper extremity  Inspection: No masses, redness, swelling, or asymmetry  Inspection: No masses, redness, swelling, or asymmetry  Functional ROM: ROM appears unrestricted          Functional ROM: ROM appears unrestricted          Muscle strength & Tone: Functionally intact  Muscle strength & Tone: Functionally intact  Sensory: Unimpaired  Sensory: Unimpaired  Palpation: Non-contributory  Palpation: Non-contributory   Thoracic Spine Exam  Inspection: No masses, redness, or swelling Alignment: Symmetrical Functional ROM: ROM appears unrestricted Stability: No instability detected Sensory: Unimpaired Muscle strength & Tone: Functionally intact Palpation: Non-contributory  Lumbar Spine Exam  Inspection: No masses, redness, or swelling Alignment: Symmetrical Functional ROM:  Decreased ROM Stability: No instability detected Muscle strength & Tone: Functionally intact Sensory: Movement-associated pain Palpation: Complains of area being tender to palpation Provocative Tests: Lumbar Hyperextension and rotation test: Positive bilaterally for facet joint pain. Patrick's Maneuver: evaluation deferred today              Gait & Posture Assessment  Ambulation: Unassisted Gait: Relatively normal for age and body habitus Posture: WNL   Lower Extremity Exam    Side: Right lower extremity  Side: Left lower extremity  Inspection: No masses, redness, swelling, or asymmetry  Inspection: No masses, redness, swelling, or asymmetry  Functional ROM: ROM appears unrestricted          Functional ROM: ROM appears unrestricted          Muscle strength & Tone: Functionally intact  Muscle strength & Tone: Functionally intact  Sensory: Unimpaired  Sensory: Unimpaired  Palpation: Non-contributory  Palpation: Non-contributory    Assessment & Plan  Primary Diagnosis & Pertinent Problem List: The primary encounter diagnosis was Chronic pain. Diagnoses of Long term current use of opiate analgesic, Opiate use (30  MME/day), Pain, foot, chronic, unspecified laterality, Chronic hip pain, unspecified laterality, Chronic knee pain (Location of Tertiary source of pain) (Bilateral) (L>R), Neurogenic pain, and Encounter for therapeutic drug level monitoring were also pertinent to this visit.  Visit Diagnosis: 1. Chronic pain   2. Long term current use of opiate analgesic   3. Opiate use (30 MME/day)   4. Pain, foot, chronic, unspecified laterality   5. Chronic hip pain, unspecified laterality   6. Chronic knee pain (Location of Tertiary source of pain) (Bilateral) (L>R)   7. Neurogenic pain   8. Encounter for therapeutic drug level monitoring     Problems updated and reviewed during this visit: No problems updated.  Problem-specific Plan(s): No problem-specific Assessment & Plan notes  found for this encounter.  No new Assessment & Plan notes have been filed under this hospital service since the last note was generated. Service: Pain Management   Plan of Care   Problem List Items Addressed This Visit      High   Chronic feet pain (Location of Primary Source of Pain) (Bilateral) (R>L) (Chronic)   Chronic hip pain (Location of Secondary source of pain) (Bilateral) (L>R) (Chronic)   Relevant Medications   oxyCODONE (OXY IR/ROXICODONE) 5 MG immediate release tablet (Start on 07/07/2016)   oxyCODONE (OXY IR/ROXICODONE) 5 MG immediate release tablet (Start on 08/06/2016)   oxyCODONE (OXY IR/ROXICODONE) 5 MG immediate release tablet (Start on 09/05/2016)   gabapentin (NEURONTIN) 100 MG capsule (Start on 07/07/2016)   gabapentin (NEURONTIN) 800 MG tablet (Start on 07/07/2016)   oxyCODONE (OXY IR/ROXICODONE) 5 MG immediate release tablet   Chronic knee pain (Location of Tertiary source of pain) (Bilateral) (L>R) (Chronic)   Relevant Medications   oxyCODONE (OXY IR/ROXICODONE) 5 MG immediate release tablet (Start on 07/07/2016)   oxyCODONE (OXY IR/ROXICODONE) 5 MG immediate release tablet (Start on 08/06/2016)   oxyCODONE (OXY IR/ROXICODONE) 5 MG immediate release tablet (Start on 09/05/2016)   gabapentin (NEURONTIN) 100 MG capsule (Start on 07/07/2016)   gabapentin (NEURONTIN) 800 MG tablet (Start on 07/07/2016)   oxyCODONE (OXY IR/ROXICODONE) 5 MG immediate release tablet   Chronic pain - Primary (Chronic)   Relevant Medications   oxyCODONE (OXY IR/ROXICODONE) 5 MG immediate release tablet (Start on 07/07/2016)   oxyCODONE (OXY IR/ROXICODONE) 5 MG immediate release tablet (Start on 08/06/2016)   oxyCODONE (OXY IR/ROXICODONE) 5 MG immediate release tablet (Start on 09/05/2016)   gabapentin (NEURONTIN) 100 MG capsule (Start on 07/07/2016)   gabapentin (NEURONTIN) 800 MG tablet (Start on 07/07/2016)   oxyCODONE (OXY IR/ROXICODONE) 5 MG immediate release tablet   Neurogenic pain  (Chronic)   Relevant Medications   gabapentin (NEURONTIN) 100 MG capsule (Start on 07/07/2016)   gabapentin (NEURONTIN) 800 MG tablet (Start on 07/07/2016)     Medium   Encounter for therapeutic drug level monitoring   Long term current use of opiate analgesic (Chronic)   Relevant Orders   ToxASSURE Select 13 (MW), Urine   Opiate use (30 MME/day) (Chronic)    Other Visit Diagnoses   None.      Pharmacotherapy (Medications Ordered): Meds ordered this encounter  Medications  . oxyCODONE (OXY IR/ROXICODONE) 5 MG immediate release tablet    Sig: Take 1 tablet (5 mg total) by mouth every 6 (six) hours as needed for severe pain.    Dispense:  120 tablet    Refill:  0    Do not place this medication, or any other prescription from our practice, on "Automatic Refill". Patient may  have prescription filled one day early if pharmacy is closed on scheduled refill date. Do not fill until: 07/07/16 To last until: 08/06/16  . oxyCODONE (OXY IR/ROXICODONE) 5 MG immediate release tablet    Sig: Take 1 tablet (5 mg total) by mouth every 6 (six) hours as needed for severe pain.    Dispense:  120 tablet    Refill:  0    Do not place this medication, or any other prescription from our practice, on "Automatic Refill". Patient may have prescription filled one day early if pharmacy is closed on scheduled refill date. Do not fill until: 08/06/16 To last until: 09/05/16  . oxyCODONE (OXY IR/ROXICODONE) 5 MG immediate release tablet    Sig: Take 1 tablet (5 mg total) by mouth every 6 (six) hours as needed for severe pain.    Dispense:  120 tablet    Refill:  0    Do not place this medication, or any other prescription from our practice, on "Automatic Refill". Patient may have prescription filled one day early if pharmacy is closed on scheduled refill date. Do not fill until: 09/05/16 To last until: 10/05/16  . gabapentin (NEURONTIN) 100 MG capsule    Sig: Take 1 capsule (100 mg total) by mouth 4 (four)  times daily.    Dispense:  120 capsule    Refill:  2    Do not place this medication, or any other prescription from our practice, on "Automatic Refill". Patient may have prescription filled one day early if pharmacy is closed on scheduled refill date.  . gabapentin (NEURONTIN) 800 MG tablet    Sig: Take 1 tablet (800 mg total) by mouth 4 (four) times daily.    Dispense:  120 tablet    Refill:  2    Do not add this medication to the electronic "Automatic Refill" notification system. Patient may have prescription filled one day early if pharmacy is closed on scheduled refill date.    Lab-work & Procedure Ordered: Orders Placed This Encounter  Procedures  . ToxASSURE Select 13 (MW), Urine    Imaging Ordered: None  Interventional Therapies: Scheduled:  None at this time.    Considering:  1. Diagnostic bilateral intra-articular hip joint injection under fluoroscopic guidance, with or without sedation.  2. Possible bilateral hip joint radiofrequency ablation under fluoroscopic guidance and IV sedation, the pending on the results of the diagnostic injection.  3. Diagnostic bilateral intra-articular knee joint injection, without fluoroscopy or IV sedation using steroids versus a series of 5 Hyalgan knee injections.  4. Diagnostic bilateral genicular nerve block under fluoroscopic guidance and IV sedation  5. Possible diagnostic bilateral genicular nerve radiofrequency ablation under fluoroscopic guidance and IV sedation, the pending on the results of the diagnostic injection.  6. Diagnostic bilateral lumbar facet block under fluoroscopic guidance and IV sedation.  7. Possible bilateral lumbar facet radiofrequency ablation under fluoroscopic guidance and IV sedation the pending on the results of the diagnostic injection.  8. Diagnostic right-sided cervical epidural steroid injection under fluoroscopic guidance, with or without sedation.  9. Diagnostic right-sided cervical facet block under  fluoroscopic guidance and IV sedation.  10. Possible right-sided cervical facet radiofrequency ablation under fluoroscopic guidance and IV sedation the pending on the results of the diagnostic injection.    PRN Procedures:   1. Diagnostic bilateral intra-articular hip joint injection under fluoroscopic guidance, with or without sedation.  2. Diagnostic bilateral intra-articular knee joint injection, without fluoroscopy or IV sedation using steroids versus a series of  5 Hyalgan knee injections.  3. Diagnostic bilateral genicular nerve block under fluoroscopic guidance and IV sedation  4. Diagnostic bilateral lumbar facet block under fluoroscopic guidance and IV sedation.  5. Diagnostic right-sided cervical epidural steroid injection under fluoroscopic guidance, with or without sedation.  6. Diagnostic right-sided cervical facet block under fluoroscopic guidance and IV sedation.    Referral(s) or Consult(s): None at this time.  New Prescriptions   No medications on file    Medications administered during this visit: Margaret Diaz had no medications administered during this visit.  Requested PM Follow-up: Return in 3 months (on 09/23/2016) for Med-Mgmt, In addition, (PRN) Procedure.  Future Appointments Date Time Provider Department Center  09/23/2016 1:00 PM Delano Metz, MD [facility] None    Primary Care Physician: Jerl Mina, MD Location: [facility] Note by: Janira Mandell A. Laban Emperor, M.D, DABA, DABAPM, DABPM, DABIPP, FIPP  Pain Score Disclaimer: We use the NRS-11 scale. This is a self-reported, subjective measurement of pain severity with only modest accuracy. It is used primarily to identify changes within a particular patient. It must be understood that outpatient pain scales are significantly less accurate that those used for research, where they can be applied under ideal controlled circumstances with minimal exposure to variables. In reality, the  score is likely to be a combination of pain intensity and pain affect, where pain affect describes the degree of emotional arousal or changes in action readiness caused by the sensory experience of pain. Factors such as social and work situation, setting, emotional state, anxiety levels, expectation, and prior pain experience may influence pain perception and show large inter-individual differences that may also be affected by time variables.  Patient instructions provided during this appointment: There are no Patient Instructions on file for this visit.

## 2016-06-26 NOTE — Progress Notes (Signed)
Safety precautions to be maintained throughout the outpatient stay will include: orient to surroundings, keep bed in low position, maintain call bell within reach at all times, provide assistance with transfer out of bed and ambulation.  Oxycodone  5 mg #46 out of 120 remaining. Filled 06-07-16.

## 2016-07-02 LAB — TOXASSURE SELECT 13 (MW), URINE

## 2016-09-23 ENCOUNTER — Encounter: Payer: Self-pay | Admitting: Pain Medicine

## 2016-09-24 ENCOUNTER — Ambulatory Visit: Payer: Self-pay | Admitting: Pain Medicine

## 2016-10-01 ENCOUNTER — Ambulatory Visit: Payer: Self-pay | Attending: Pain Medicine | Admitting: Pain Medicine

## 2016-10-01 ENCOUNTER — Encounter: Payer: Self-pay | Admitting: Pain Medicine

## 2016-10-01 VITALS — BP 125/74 | Temp 98.3°F | Ht 66.0 in | Wt 180.0 lb

## 2016-10-01 DIAGNOSIS — Z79891 Long term (current) use of opiate analgesic: Secondary | ICD-10-CM | POA: Insufficient documentation

## 2016-10-01 DIAGNOSIS — K5903 Drug induced constipation: Secondary | ICD-10-CM

## 2016-10-01 DIAGNOSIS — M25562 Pain in left knee: Secondary | ICD-10-CM

## 2016-10-01 DIAGNOSIS — M792 Neuralgia and neuritis, unspecified: Secondary | ICD-10-CM | POA: Insufficient documentation

## 2016-10-01 DIAGNOSIS — G894 Chronic pain syndrome: Secondary | ICD-10-CM | POA: Insufficient documentation

## 2016-10-01 DIAGNOSIS — M25551 Pain in right hip: Secondary | ICD-10-CM | POA: Insufficient documentation

## 2016-10-01 DIAGNOSIS — F1190 Opioid use, unspecified, uncomplicated: Secondary | ICD-10-CM

## 2016-10-01 DIAGNOSIS — M25569 Pain in unspecified knee: Secondary | ICD-10-CM | POA: Insufficient documentation

## 2016-10-01 DIAGNOSIS — M545 Low back pain: Secondary | ICD-10-CM

## 2016-10-01 DIAGNOSIS — T402X5A Adverse effect of other opioids, initial encounter: Secondary | ICD-10-CM

## 2016-10-01 DIAGNOSIS — M79673 Pain in unspecified foot: Secondary | ICD-10-CM

## 2016-10-01 DIAGNOSIS — M25552 Pain in left hip: Secondary | ICD-10-CM | POA: Insufficient documentation

## 2016-10-01 DIAGNOSIS — M25561 Pain in right knee: Secondary | ICD-10-CM

## 2016-10-01 DIAGNOSIS — G8929 Other chronic pain: Secondary | ICD-10-CM | POA: Insufficient documentation

## 2016-10-01 DIAGNOSIS — M79672 Pain in left foot: Secondary | ICD-10-CM | POA: Insufficient documentation

## 2016-10-01 DIAGNOSIS — M25559 Pain in unspecified hip: Secondary | ICD-10-CM

## 2016-10-01 MED ORDER — OXYCODONE HCL 5 MG PO TABS: 5.0000 mg | ORAL_TABLET | Freq: Four times a day (QID) | ORAL | 0 refills | Status: DC | PRN

## 2016-10-01 MED ORDER — GABAPENTIN 800 MG PO TABS: 800.0000 mg | ORAL_TABLET | Freq: Four times a day (QID) | ORAL | 2 refills | Status: DC

## 2016-10-01 MED ORDER — GABAPENTIN 100 MG PO CAPS: 100.0000 mg | ORAL_CAPSULE | Freq: Four times a day (QID) | ORAL | 2 refills | Status: DC

## 2016-10-01 NOTE — Progress Notes (Signed)
Patient's Name: Margaret Diaz  MRN: [id number]  Referring Provider: Maryland Pink, MD  DOB: [birth year]  PCP: Maryland Pink, MD  DOS: 10/01/2016  Note by: Kathlen Brunswick. Dossie Arbour, MD  Service setting: Ambulatory outpatient  Specialty: Interventional Pain Management  Location: [facility] (AMB) Pain Management Facility    Patient type: Established   Primary Reason(s) for Visit: Encounter for prescription drug management (Level of risk: moderate) CC: Joint Pain; Knee Pain (bilateral); and Hip Pain (bilateral)  HPI  Ms. Croy is a 50 y.o. year old, female patient, who comes today for a medication management evaluation. She has Dysuria; Long term current use of opiate analgesic; Long term prescription opiate use; Opiate use (30 MME/day); Opiate dependence ([place]); Encounter for therapeutic drug level monitoring; Encounter for chronic pain management; Opioid-induced constipation (OIC); Neurogenic pain; Neuropathic pain; Generalized pain; Irritable bowel syndrome; History of bilateral breast implants; GERD (gastroesophageal reflux disease); Peptic ulcer disease; Arthritis associated with inflammatory bowel disease; Plantar fasciitis (Location of Primary Source of Pain) (Bilateral) (R>L); Chronic hip pain (Location of Secondary source of pain) (Bilateral) (L>R); Chronic knee pain (Location of Tertiary source of pain) (Bilateral) (L>R); Chronic neck pain (Right); Chronic low back pain (Bilateral) (L>R); Chronic hand pain (Bilateral) (R>L); Elevated sedimentation rate; Chronic pain syndrome; and Chronic feet pain (Location of Primary Source of Pain) (Bilateral) (L>R) (Burning) on her problem list. Her primarily concern today is the Joint Pain; Knee Pain (bilateral); and Hip Pain (bilateral)  Pain Assessment: Self-Reported Pain Score: 4 /10             Reported level is compatible with observation.       Pain Type: Chronic pain Pain Location: Knee (hips) Pain Orientation: Right, Left Pain Descriptors / Indicators:  Aching, Throbbing, Burning (burning in feet) Pain Frequency: Constant  Ms. Casa was last seen on 06/26/2016 for medication management. During today's appointment we reviewed Ms. Wilcoxson's chronic pain status, as well as her outpatient medication regimen.  The patient  reports that she does not use drugs. Her body mass index is 29.05 kg/m.  Further details on both, my assessment(s), as well as the proposed treatment plan, please see below.  Controlled Substance Pharmacotherapy Assessment REMS (Risk Evaluation and Mitigation Strategy)  Analgesic:Oxycodone IR 5 mg every 6 hours (20 mg/day) MME/day:30 mg/day Zenovia Jarred, RN  10/01/2016  9:58 AM  Signed Nursing Pain Medication Assessment:  Safety precautions to be maintained throughout the outpatient stay will include: orient to surroundings, keep bed in low position, maintain call bell within reach at all times, provide assistance with transfer out of bed and ambulation.  Medication Inspection Compliance: Pill count conducted under aseptic conditions, in front of the patient. Neither the pills nor the bottle was removed from the patient's sight at any time. Once count was completed pills were immediately returned to the patient in their original bottle. Pill Count: 16 of 120 pills remain Bottle Appearance: Standard pharmacy container. Clearly labeled. Medication: Oxycodone IR Filled Date: 16 / 16 / 2017   Pharmacokinetics: Liberation and absorption (onset of action): WNL Distribution (time to peak effect): WNL Metabolism and excretion (duration of action): WNL         Pharmacodynamics: Desired effects: Analgesia: Ms. Harpole reports >50% benefit. Functional ability: Patient reports that medication allows her to accomplish basic ADLs Clinically meaningful improvement in function (CMIF): Sustained CMIF goals met Perceived effectiveness: Described as relatively effective, allowing for increase in activities of daily living (ADL) Undesirable  effects: Side-effects or Adverse reactions: None reported Monitoring:  [place] PMP: Online review of the past 43-monthperiod conducted. Compliant with practice rules and regulations List of all UDS test(s) done:  Lab Results  Component Value Date   TOXASSSELUR FINAL 06/26/2016   TOXASSSELUR FINAL 04/03/2016   TOXASSSELUR FINAL 01/03/2016   TOXASSSELUR FINAL 12/06/2015   TOXASSSELUR FINAL 09/11/2015   Last UDS on record: ToxAssure Select 13  Date Value Ref Range Status  06/26/2016 FINAL  Final    Comment:    ==================================================================== TOXASSURE SELECT 13 (MW) ==================================================================== Test                             Result       Flag       Units Drug Present and Declared for Prescription Verification   Desmethyldiazepam              1035         EXPECTED   ng/mg creat   Oxazepam                       3950         EXPECTED   ng/mg creat   Temazepam                      2950         EXPECTED   ng/mg creat    Desmethyldiazepam, oxazepam, and temazepam are expected    metabolites of diazepam. Desmethyldiazepam and oxazepam are also    expected metabolites of other drugs, including chlordiazepoxide,    prazepam, clorazepate, and halazepam. Oxazepam is an expected    metabolite of temazepam. Oxazepam and temazepam are also    available as scheduled prescription medications. Drug Absent but Declared for Prescription Verification   Oxycodone                      Not Detected UNEXPECTED ng/mg creat ==================================================================== Test                      Result    Flag   Units      Ref Range   Creatinine              20               mg/dL      >=20 ==================================================================== Declared Medications:  The flagging and interpretation on this report are based on the  following declared medications.  Unexpected results may arise  from  inaccuracies in the declared medications.  **Note: The testing scope of this panel includes these medications:  Diazepam  Oxycodone  **Note: The testing scope of this panel does not include following  reported medications:  Bupropion (Wellbutrin)  Diphenhydramine (Benadryl)  Gabapentin  Lidocaine  Omeprazole  Quetiapine (Seroquel)  Sucralfate (Carafate)  Triamcinolone (Kenalog) ==================================================================== For clinical consultation, please call [phone] ====================================================================    UDS interpretation: Compliant          Medication Assessment Form: Reviewed. Patient indicates being compliant with therapy Treatment compliance: Compliant Risk Assessment Profile: Aberrant behavior: See prior evaluations. None observed or detected today Comorbid factors increasing risk of overdose: See prior notes. No additional risks detected today Risk of substance use disorder (SUD): Low Opioid Risk Tool (ORT) Total Score: 1  Interpretation Table:  Score <3 = Low Risk for SUD  Score between 4-7 = Moderate Risk for SUD  Score >  8 = High Risk for Opioid Abuse   Risk Mitigation Strategies:  Patient Counseling: Covered Patient-Prescriber Agreement (PPA): Present and active  Notification to other healthcare providers: Done  Pharmacologic Plan: No change in therapy, at this time  Laboratory Chemistry  Inflammation Markers Lab Results  Component Value Date   ESRSEDRATE 23 (H) 12/06/2015   CRP <0.5 12/06/2015   Renal Function Lab Results  Component Value Date   BUN 12 12/06/2015   CREATININE 0.61 12/06/2015   GFRAA >60 12/06/2015   GFRNONAA >60 12/06/2015   Hepatic Function Lab Results  Component Value Date   AST 22 12/06/2015   ALT 18 12/06/2015   ALBUMIN 4.3 12/06/2015   Electrolytes Lab Results  Component Value Date   NA 138 12/06/2015   K 3.9 12/06/2015   CL 107 12/06/2015    CALCIUM 9.0 12/06/2015   MG 2.1 12/06/2015   Pain Modulating Vitamins No results found for: Maralyn Sago [id number], [id number], [id number], 25OHVITD1, 25OHVITD2, 25OHVITD3, VITAMINB12 Coagulation Parameters Lab Results  Component Value Date   PLT 249 06/30/2015   Cardiovascular Lab Results  Component Value Date   BNP 86 09/13/2014   HGB 12.8 06/30/2015   HCT 39.2 06/30/2015   Note: Lab results reviewed.  Recent Diagnostic Imaging Review  Ct Abdomen Pelvis W Contrast  Result Date: 06/30/2015 CLINICAL DATA:  Possible bowel perforation. Abdominal pain in difficulty voiding EXAM: CT ABDOMEN AND PELVIS WITH CONTRAST TECHNIQUE: Multidetector CT imaging of the abdomen and pelvis was performed using the standard protocol following bolus administration of intravenous contrast. CONTRAST:  13m OMNIPAQUE IOHEXOL 300 MG/ML SOLN, 270mOMNIPAQUE IOHEXOL 240 MG/ML SOLN COMPARISON:  03/30/2015 FINDINGS: Lower chest: There is a small right pleural effusion identified. Subsegmental atelectasis is noted in the lung bases. Hepatobiliary: There is no suspicious liver abnormality. The gallbladder is normal. No biliary dilatation. Pancreas: Negative. Spleen: Negative. Adrenals/Urinary Tract: The adrenal glands are negative. Unremarkable appearance of the kidneys. The urinary bladder appears normal. Stomach/Bowel: The stomach is within normal limits. The small bowel loops have a normal course and caliber. No obstruction. Normal appearance of the colon. The appendix is visualized and appears normal. Vascular/Lymphatic: Calcified atherosclerotic disease involves the abdominal aorta. No aneurysm. No enlarged retroperitoneal or mesenteric adenopathy. No enlarged pelvic or inguinal lymph nodes. Reproductive: Previous hysterectomy.  There is no adnexal mass. Other: No free fluid or fluid collection within the abdomen or pelvis. There is no free intraperitoneal air identified. Musculoskeletal: Question nondisplaced right eighth  rib fracture, image number 26 of series 7. Correlate for any focal lateral rib pain on the right side. IMPRESSION: 1. No acute findings within the abdomen or pelvis. 2. Small right pleural effusion and bibasilar atelectasis. 3. Cannot rule out a nondisplaced fracture involving the lateral aspect of the right eighth rib. Correlate for any focal tenderness in this region. Electronically Signed   By: TaKerby Moors.D.   On: 06/30/2015 18:48   Note: Imaging results reviewed.          Meds  The patient has a current medication list which includes the following prescription(s): bupropion, diazepam, diphenhydramine, gabapentin, gabapentin, omeprazole, oxycodone, oxycodone, oxycodone, quetiapine, sucralfate, and triamcinolone cream.  Current Outpatient Prescriptions on File Prior to Visit  Medication Sig  . buPROPion (WELLBUTRIN SR) 150 MG 12 hr tablet Take 150 mg by mouth 2 (two) times daily.  . diphenhydrAMINE (BENADRYL) 25 MG tablet Take 25 mg by mouth every 6 (six) hours as needed.  . Marland Kitchenmeprazole (PRILOSEC) 40 MG capsule Take  40 mg by mouth 2 (two) times daily.  . QUEtiapine (SEROQUEL) 100 MG tablet Take 100 mg by mouth 2 (two) times daily.  . sucralfate (CARAFATE) 1 G tablet Take 1 tablet by mouth 2 (two) times daily.   Marland Kitchen triamcinolone cream (KENALOG) 0.1 % Apply 1 application topically 2 (two) times daily.   No current facility-administered medications on file prior to visit.    ROS  Constitutional: Denies any fever or chills Gastrointestinal: No reported hemesis, hematochezia, vomiting, or acute GI distress Musculoskeletal: Denies any acute onset joint swelling, redness, loss of ROM, or weakness Neurological: No reported episodes of acute onset apraxia, aphasia, dysarthria, agnosia, amnesia, paralysis, loss of coordination, or loss of consciousness  Allergies  Ms. Widener is allergic to erythromycin ethylsuccinate; erythromycin; hydromorphone; hydromorphone hcl; and meperidine.  PFSH  Drug:  Ms. Littleton  reports that she does not use drugs. Alcohol:  reports that she does not drink alcohol. Tobacco:  reports that she quit smoking about 4 years ago. Her smoking use included Cigarettes. She has a 3.00 pack-year smoking history. She has never used smokeless tobacco. Medical:  has a past medical history of Arthritis; Dyspnea (05/10/2015); GERD (gastroesophageal reflux disease); bilateral breast implants; Irritable bowel syndrome (IBS); and Pleural effusion on right (04/10/2015). Family: family history includes Mental illness in her mother.  Past Surgical History:  Procedure Laterality Date  . ABDOMINAL HYSTERECTOMY    . BREAST ENHANCEMENT SURGERY  1999   Constitutional Exam  General appearance: Well nourished, well developed, and well hydrated. In no apparent acute distress Vitals:   10/01/16 0951  BP: 125/74  Temp: 98.3 F (36.8 C)  Weight: 180 lb (81.6 kg)  Height: '5\' 6"'$  (1.676 m)   BMI Assessment: Estimated body mass index is 29.05 kg/m as calculated from the following:   Height as of this encounter: '5\' 6"'$  (1.676 m).   Weight as of this encounter: 180 lb (81.6 kg).  BMI interpretation table: BMI level Category Range association with higher incidence of chronic pain  <18 kg/m2 Underweight   18.5-24.9 kg/m2 Ideal body weight   25-29.9 kg/m2 Overweight Increased incidence by 20%  30-34.9 kg/m2 Obese (Class I) Increased incidence by 68%  35-39.9 kg/m2 Severe obesity (Class II) Increased incidence by 136%  >40 kg/m2 Extreme obesity (Class III) Increased incidence by 254%   BMI Readings from Last 4 Encounters:  10/01/16 29.05 kg/m  06/26/16 30.02 kg/m  04/03/16 28.98 kg/m  01/03/16 29.05 kg/m   Wt Readings from Last 4 Encounters:  10/01/16 180 lb (81.6 kg)  06/26/16 186 lb (84.4 kg)  04/03/16 185 lb (83.9 kg)  01/03/16 180 lb (81.6 kg)  Psych/Mental status: Alert, oriented x 3 (person, place, & time) Eyes: PERLA Respiratory: No evidence of acute respiratory  distress  Cervical Spine Exam  Inspection: No masses, redness, or swelling Alignment: Symmetrical Functional ROM: Unrestricted ROM Stability: No instability detected Muscle strength & Tone: Functionally intact Sensory: Unimpaired Palpation: Non-contributory  Upper Extremity (UE) Exam    Side: Right upper extremity  Side: Left upper extremity  Inspection: No masses, redness, swelling, or asymmetry  Inspection: No masses, redness, swelling, or asymmetry  Functional ROM: Unrestricted ROM          Functional ROM: Unrestricted ROM          Muscle strength & Tone: Functionally intact  Muscle strength & Tone: Functionally intact  Sensory: Unimpaired  Sensory: Unimpaired  Palpation: Non-contributory  Palpation: Non-contributory   Thoracic Spine Exam  Inspection: No masses,  redness, or swelling Alignment: Symmetrical Functional ROM: Unrestricted ROM Stability: No instability detected Sensory: Unimpaired Muscle strength & Tone: Functionally intact Palpation: Non-contributory  Lumbar Spine Exam  Inspection: No masses, redness, or swelling Alignment: Symmetrical Functional ROM: Unrestricted ROM Stability: No instability detected Muscle strength & Tone: Functionally intact Sensory: Unimpaired Palpation: Non-contributory Provocative Tests: Lumbar Hyperextension and rotation test: evaluation deferred today       Patrick's Maneuver: evaluation deferred today              Gait & Posture Assessment  Ambulation: Unassisted Gait: Relatively normal for age and body habitus Posture: WNL   Lower Extremity Exam    Side: Right lower extremity  Side: Left lower extremity  Inspection: No masses, redness, swelling, or asymmetry  Inspection: No masses, redness, swelling, or asymmetry  Functional ROM: Unrestricted ROM          Functional ROM: Unrestricted ROM          Muscle strength & Tone: Functionally intact  Muscle strength & Tone: Functionally intact  Sensory: Unimpaired  Sensory: Unimpaired   Palpation: Non-contributory  Palpation: Non-contributory   Assessment  Primary Diagnosis & Pertinent Problem List: The primary encounter diagnosis was Chronic pain syndrome. Diagnoses of Chronic foot pain, unspecified laterality, Chronic hip pain, unspecified laterality, Chronic knee pain (Location of Tertiary source of pain) (Bilateral) (L>R), Chronic low back pain (Bilateral) (L>R), Neurogenic pain, Long term prescription opiate use, Opiate use (30 MME/day), Opioid-induced constipation (OIC), and Chronic pain in left foot were also pertinent to this visit.  Status Diagnosis   Stable  Stable  Stable 1. Chronic pain syndrome   2. Chronic foot pain, unspecified laterality   3. Chronic hip pain, unspecified laterality   4. Chronic knee pain (Location of Tertiary source of pain) (Bilateral) (L>R)   5. Chronic low back pain (Bilateral) (L>R)   6. Neurogenic pain   7. Long term prescription opiate use   8. Opiate use (30 MME/day)   9. Opioid-induced constipation (OIC)   10. Chronic pain in left foot      Plan of Care  Pharmacotherapy (Medications Ordered): Meds ordered this encounter  Medications  . gabapentin (NEURONTIN) 800 MG tablet    Sig: Take 1 tablet (800 mg total) by mouth 4 (four) times daily.    Dispense:  120 tablet    Refill:  2    Do not add this medication to the electronic "Automatic Refill" notification system. Patient may have prescription filled one day early if pharmacy is closed on scheduled refill date.  . gabapentin (NEURONTIN) 100 MG capsule    Sig: Take 1 capsule (100 mg total) by mouth 4 (four) times daily.    Dispense:  120 capsule    Refill:  2    Do not place this medication, or any other prescription from our practice, on "Automatic Refill". Patient may have prescription filled one day early if pharmacy is closed on scheduled refill date.  Marland Kitchen oxyCODONE (OXY IR/ROXICODONE) 5 MG immediate release tablet    Sig: Take 1 tablet (5 mg total) by mouth every 6  (six) hours as needed for severe pain.    Dispense:  120 tablet    Refill:  0    Do not place this medication, or any other prescription from our practice, on "Automatic Refill". Patient may have prescription filled one day early if pharmacy is closed on scheduled refill date. Do not fill until: 10/05/16 To last until: 11/04/16  . oxyCODONE (OXY IR/ROXICODONE) 5  MG immediate release tablet    Sig: Take 1 tablet (5 mg total) by mouth every 6 (six) hours as needed for severe pain.    Dispense:  120 tablet    Refill:  0    Do not place this medication, or any other prescription from our practice, on "Automatic Refill". Patient may have prescription filled one day early if pharmacy is closed on scheduled refill date. Do not fill until: 11/04/16 To last until: 12/04/16  . oxyCODONE (OXY IR/ROXICODONE) 5 MG immediate release tablet    Sig: Take 1 tablet (5 mg total) by mouth every 6 (six) hours as needed for severe pain.    Dispense:  120 tablet    Refill:  0    Do not place this medication, or any other prescription from our practice, on "Automatic Refill". Patient may have prescription filled one day early if pharmacy is closed on scheduled refill date. Do not fill until: 12/04/16 To last until: 01/03/17   New Prescriptions   No medications on file   Medications administered today: Ms. Borys had no medications administered during this visit. Lab-work, procedure(s), and/or referral(s): Orders Placed This Encounter  Procedures  . LUMBAR SYMPATHETIC BLOCK  . ToxASSURE Select 13 (MW), Urine   Imaging and/or referral(s): None  Interventional therapies: Planned, scheduled, and/or pending:   Diagnostic left LSB under fluoro & IV sedation.   Considering:   Diagnostic left LSB under fluoro & IV sedation. Diagnostic bilateral intra-articular hip joint injection under fluoroscopic guidance, with or without sedation.  Possible bilateral hip joint radiofrequency ablation under fluoroscopic  guidance and IV sedation, the pending on the results of the diagnostic injection.  Diagnostic bilateral intra-articular knee joint injection, without fluoroscopy or IV sedation using steroids versus a series of 5 Hyalgan knee injections.  Diagnostic bilateral genicular nerve block under fluoroscopic guidance and IV sedation  Possible diagnostic bilateral genicular nerve radiofrequency ablation under fluoroscopic guidance and IV sedation, the pending on the results of the diagnostic injection.  Diagnostic bilateral lumbar facet block under fluoroscopic guidance and IV sedation.  Possible bilateral lumbar facet radiofrequency ablation under fluoroscopic guidance and IV sedation the pending on the results of the diagnostic injection.  Diagnostic right-sided cervical epidural steroid injection under fluoroscopic guidance, with or without sedation.  Diagnostic right-sided cervical facet block under fluoroscopic guidance and IV sedation.  Possible right-sided cervical facet radiofrequency ablation under fluoroscopic guidance and IV sedation the pending on the results of the diagnostic injection.    Palliative PRN treatment(s):   Diagnostic bilateral intra-articular hip joint injection under fluoroscopic guidance, with or without sedation.  Diagnostic bilateral intra-articular knee joint injection, without fluoroscopy or IV sedation using steroids versus a series of 5 Hyalgan knee injections.  Diagnostic bilateral genicular nerve block under fluoroscopic guidance and IV sedation  Diagnostic bilateral lumbar facet block under fluoroscopic guidance and IV sedation.  Diagnostic right-sided cervical epidural steroid injection under fluoroscopic guidance, with or without sedation.  Diagnostic right-sided cervical facet block under fluoroscopic guidance and IV sedation.    Provider-requested follow-up: Return in about 3 months (around 12/30/2016) for Med-Mgmt, in addition, (PRN) procedure.  Future  Appointments Date Time Provider [place]  12/19/2016 8:30 AM Milinda Pointer, MD [facility] None   Primary Care Physician: Maryland Pink, MD Location: [facility] Note by: Keian Odriscoll A. Dossie Arbour, M.D, DABA, DABAPM, DABPM, DABIPP, FIPP Date: 10/01/16; Time: 10:56 AM  Pain Score Disclaimer: We use the NRS-11 scale. This is a self-reported, subjective measurement of  pain severity with only modest accuracy. It is used primarily to identify changes within a particular patient. It must be understood that outpatient pain scales are significantly less accurate that those used for research, where they can be applied under ideal controlled circumstances with minimal exposure to variables. In reality, the score is likely to be a combination of pain intensity and pain affect, where pain affect describes the degree of emotional arousal or changes in action readiness caused by the sensory experience of pain. Factors such as social and work situation, setting, emotional state, anxiety levels, expectation, and prior pain experience may influence pain perception and show large inter-individual differences that may also be affected by time variables.  Patient instructions provided during this appointment: Patient Instructions   GENERAL RISKS AND COMPLICATIONS  What are the risk, side effects and possible complications? Generally speaking, most procedures are safe.  However, with any procedure there are risks, side effects, and the possibility of complications.  The risks and complications are dependent upon the sites that are lesioned, or the type of nerve block to be performed.  The closer the procedure is to the spine, the more serious the risks are.  Great care is taken when placing the radio frequency needles, block needles or lesioning probes, but sometimes complications can occur. 1. Infection: Any time there is an injection through the skin, there is a risk of infection.   This is why sterile conditions are used for these blocks.  There are four possible types of infection. 1. Localized skin infection. 2. Central Nervous System Infection-This can be in the form of Meningitis, which can be deadly. 3. Epidural Infections-This can be in the form of an epidural abscess, which can cause pressure inside of the spine, causing compression of the spinal cord with subsequent paralysis. This would require an emergency surgery to decompress, and there are no guarantees that the patient would recover from the paralysis. 4. Discitis-This is an infection of the intervertebral discs.  It occurs in about 1% of discography procedures.  It is difficult to treat and it may lead to surgery.        2. Pain: the needles have to go through skin and soft tissues, will cause soreness.       3. Damage to internal structures:  The nerves to be lesioned may be near blood vessels or    other nerves which can be potentially damaged.       4. Bleeding: Bleeding is more common if the patient is taking blood thinners such as  aspirin, Coumadin, Ticiid, Plavix, etc., or if he/she have some genetic predisposition  such as hemophilia. Bleeding into the spinal canal can cause compression of the spinal  cord with subsequent paralysis.  This would require an emergency surgery to  decompress and there are no guarantees that the patient would recover from the  paralysis.       5. Pneumothorax:  Puncturing of a lung is a possibility, every time a needle is introduced in  the area of the chest or upper back.  Pneumothorax refers to free air around the  collapsed lung(s), inside of the thoracic cavity (chest cavity).  Another two possible  complications related to a similar event would include: Hemothorax and Chylothorax.   These are variations of the Pneumothorax, where instead of air around the collapsed  lung(s), you may have blood or chyle, respectively.       6. Spinal headaches: They may occur with any  procedures in  the area of the spine.       7. Persistent CSF (Cerebro-Spinal Fluid) leakage: This is a rare problem, but may occur  with prolonged intrathecal or epidural catheters either due to the formation of a fistulous  track or a dural tear.       8. Nerve damage: By working so close to the spinal cord, there is always a possibility of  nerve damage, which could be as serious as a permanent spinal cord injury with  paralysis.       9. Death:  Although rare, severe deadly allergic reactions known as "Anaphylactic  reaction" can occur to any of the medications used.      10. Worsening of the symptoms:  We can always make thing worse.  What are the chances of something like this happening? Chances of any of this occuring are extremely low.  By statistics, you have more of a chance of getting killed in a motor vehicle accident: while driving to the hospital than any of the above occurring .  Nevertheless, you should be aware that they are possibilities.  In general, it is similar to taking a shower.  Everybody knows that you can slip, hit your head and get killed.  Does that mean that you should not shower again?  Nevertheless always keep in mind that statistics do not mean anything if you happen to be on the wrong side of them.  Even if a procedure has a 1 (one) in a 1,000,000 (million) chance of going wrong, it you happen to be that one..Also, keep in mind that by statistics, you have more of a chance of having something go wrong when taking medications.  Who should not have this procedure? If you are on a blood thinning medication (e.g. Coumadin, Plavix, see list of "Blood Thinners"), or if you have an active infection going on, you should not have the procedure.  If you are taking any blood thinners, please inform your physician.  How should I prepare for this procedure?  Do not eat or drink anything at least six hours prior to the procedure.  Bring a driver with you .  It cannot be a  taxi.  Come accompanied by an adult that can drive you back, and that is strong enough to help you if your legs get weak or numb from the local anesthetic.  Take all of your medicines the morning of the procedure with just enough water to swallow them.  If you have diabetes, make sure that you are scheduled to have your procedure done first thing in the morning, whenever possible.  If you have diabetes, take only half of your insulin dose and notify our nurse that you have done so as soon as you arrive at the clinic.  If you are diabetic, but only take blood sugar pills (oral hypoglycemic), then do not take them on the morning of your procedure.  You may take them after you have had the procedure.  Do not take aspirin or any aspirin-containing medications, at least eleven (11) days prior to the procedure.  They may prolong bleeding.  Wear loose fitting clothing that may be easy to take off and that you would not mind if it got stained with Betadine or blood.  Do not wear any jewelry or perfume  Remove any nail coloring.  It will interfere with some of our monitoring equipment.  NOTE: Remember that this is not meant to be interpreted as a complete list of all  possible complications.  Unforeseen problems may occur.  BLOOD THINNERS The following drugs contain aspirin or other products, which can cause increased bleeding during surgery and should not be taken for 2 weeks prior to and 1 week after surgery.  If you should need take something for relief of minor pain, you may take acetaminophen which is found in Tylenol,m Datril, Anacin-3 and Panadol. It is not blood thinner. The products listed below are.  Do not take any of the products listed below in addition to any listed on your instruction sheet.  A.P.C or A.P.C with Codeine Codeine Phosphate Capsules #3 Ibuprofen Ridaura  ABC compound Congesprin Imuran rimadil  Advil Cope Indocin Robaxisal  Alka-Seltzer Effervescent Pain Reliever and  Antacid Coricidin or Coricidin-D  Indomethacin Rufen  Alka-Seltzer plus Cold Medicine Cosprin Ketoprofen S-A-C Tablets  Anacin Analgesic Tablets or Capsules Coumadin Korlgesic Salflex  Anacin Extra Strength Analgesic tablets or capsules CP-2 Tablets Lanoril Salicylate  Anaprox Cuprimine Capsules Levenox Salocol  Anexsia-D Dalteparin Magan Salsalate  Anodynos Darvon compound Magnesium Salicylate Sine-off  Ansaid Dasin Capsules Magsal Sodium Salicylate  Anturane Depen Capsules Marnal Soma  APF Arthritis pain formula Dewitt's Pills Measurin Stanback  Argesic Dia-Gesic Meclofenamic Sulfinpyrazone  Arthritis Bayer Timed Release Aspirin Diclofenac Meclomen Sulindac  Arthritis pain formula Anacin Dicumarol Medipren Supac  Analgesic (Safety coated) Arthralgen Diffunasal Mefanamic Suprofen  Arthritis Strength Bufferin Dihydrocodeine Mepro Compound Suprol  Arthropan liquid Dopirydamole Methcarbomol with Aspirin Synalgos  ASA tablets/Enseals Disalcid Micrainin Tagament  Ascriptin Doan's Midol Talwin  Ascriptin A/D Dolene Mobidin Tanderil  Ascriptin Extra Strength Dolobid Moblgesic Ticlid  Ascriptin with Codeine Doloprin or Doloprin with Codeine Momentum Tolectin  Asperbuf Duoprin Mono-gesic Trendar  Aspergum Duradyne Motrin or Motrin IB Triminicin  Aspirin plain, buffered or enteric coated Durasal Myochrisine Trigesic  Aspirin Suppositories Easprin Nalfon Trillsate  Aspirin with Codeine Ecotrin Regular or Extra Strength Naprosyn Uracel  Atromid-S Efficin Naproxen Ursinus  Auranofin Capsules Elmiron Neocylate Vanquish  Axotal Emagrin Norgesic Verin  Azathioprine Empirin or Empirin with Codeine Normiflo Vitamin E  Azolid Emprazil Nuprin Voltaren  Bayer Aspirin plain, buffered or children's or timed BC Tablets or powders Encaprin Orgaran Warfarin Sodium  Buff-a-Comp Enoxaparin Orudis Zorpin  Buff-a-Comp with Codeine Equegesic Os-Cal-Gesic   Buffaprin Excedrin plain, buffered or Extra Strength  Oxalid   Bufferin Arthritis Strength Feldene Oxphenbutazone   Bufferin plain or Extra Strength Feldene Capsules Oxycodone with Aspirin   Bufferin with Codeine Fenoprofen Fenoprofen Pabalate or Pabalate-SF   Buffets II Flogesic Panagesic   Buffinol plain or Extra Strength Florinal or Florinal with Codeine Panwarfarin   Buf-Tabs Flurbiprofen Penicillamine   Butalbital Compound Four-way cold tablets Penicillin   Butazolidin Fragmin Pepto-Bismol   Carbenicillin Geminisyn Percodan   Carna Arthritis Reliever Geopen Persantine   Carprofen Gold's salt Persistin   Chloramphenicol Goody's Phenylbutazone   Chloromycetin Haltrain Piroxlcam   Clmetidine heparin Plaquenil   Cllnoril Hyco-pap Ponstel   Clofibrate Hydroxy chloroquine Propoxyphen         Before stopping any of these medications, be sure to consult the physician who ordered them.  Some, such as Coumadin (Warfarin) are ordered to prevent or treat serious conditions such as "deep thrombosis", "pumonary embolisms", and other heart problems.  The amount of time that you may need off of the medication may also vary with the medication and the reason for which you were taking it.  If you are taking any of these medications, please make sure you notify your pain physician before you undergo any procedures.  Lumbar Sympathetic Block Patient Information  Description: The lumbar plexus is a group of nerves that are part of the sympathetic nervous system.  These nerves supply organs in the pelvis and legs.  Lumbar sympathetic blocks are utilized for the diagnosis and treatment of painful conditions in these areas.   The lumbar plexus is located on both sides of the aorta at approximately the level of the second lumbar vertebral body.  The block will be performed with you lying on your abdomen with a pillow underneath.  Using direct x-ray guidance,   The plexus will be located on both sides of the spine.  Numbing medicine will be used to  deaden the skin prior to needle insertion.  In most cases, a small amount of sedation can be give by IV prior to the numbing medicine.  One or two small needles will be placed near the plexus and local anesthetic will be injected.  This may make your leg(s) feel warm.  The Entire block usually lasts about 15-25 minutes.  Conditions which may be treated by lumbar sympathetic block:   Reflex sympathetic dystrophy  Phantom limb pain  Peripheral neuropathy  Peripheral vascular disease ( inadequate blood flow )  Cancer pain of pelvis, leg and kidney  Preparation for the injection:  1. Do note eat any solid food or diary products within 8 hours of your appointment. 2. You may drink clear liquids up to 3 hours before appointment.  Clear liquids include water, black coffee, juice or soda.  No milk or cream please. 3. You may take your regular medication, including pain medications, with a sip of water before you appointment.  Diabetics should hold regular insulin ( if taken separately ) and take 1/2 NPH dose the morning of the procedure .  Carry some sugar containing items with you to your appointment. 4. A driver must accompany you and be prepared to drive you home after your procedure. 5. Bring all your current medication with you. 6. An IV may be inserted and sedation may be given at the discretion of the physician.  7. A blood pressure cuff, EKG and other monitors will often be applied during the procedure.  Some patients may need to have extra oxygen administered for a short period. 8. You will be asked to provide medical information, including your allergies and medications, prior to the procedure.  We must know immediately if your taking blood thinners (like Coumadin/Warfarin) or if you are allergic to IV iodine contrast (dye).  We must know if you could possibly be pregnant.  Possible side-effects   Bleeding from needle site or deeper  Infection (rare, can require surgery)  Nerve  injury (rare)  Numbness & tingling (temporary)  Collapsed lung (rare)  Spinal headache (a headache worse with upright posture)  Light-headedness (temporary)  Pain at injection site (several days)  Decreased blood pressure (temporary)  Weakness in legs (temporary)  Seizure or other drug reaction (rare)  Call if you experience:   Fever/chills associated with headache or increased back/ neck pain  Headache worsened by an upright position  New onset weakness or numbness of an extremity below the injection site  Hives or difficulty breathing ( go to the emergency room)  Inflammation or drainage at the injections site(s)  New symptoms which are concerning to you  Please note:  If effective, we will often do a series of 2-3 injections spaced 3-6 weeks apart to maximally decrease your pain.  If initial series is effective, you may  be a candidate for a more permanent block of the lumbar sympathetic plexus.  If you have any questions please call [phone] [facility]  Script in hand for oxycodone x 3

## 2016-10-01 NOTE — Patient Instructions (Signed)
GENERAL RISKS AND COMPLICATIONS  What are the risk, side effects and possible complications? Generally speaking, most procedures are safe.  However, with any procedure there are risks, side effects, and the possibility of complications.  The risks and complications are dependent upon the sites that are lesioned, or the type of nerve block to be performed.  The closer the procedure is to the spine, the more serious the risks are.  Great care is taken when placing the radio frequency needles, block needles or lesioning probes, but sometimes complications can occur. 1. Infection: Any time there is an injection through the skin, there is a risk of infection.  This is why sterile conditions are used for these blocks.  There are four possible types of infection. 1. Localized skin infection. 2. Central Nervous System Infection-This can be in the form of Meningitis, which can be deadly. 3. Epidural Infections-This can be in the form of an epidural abscess, which can cause pressure inside of the spine, causing compression of the spinal cord with subsequent paralysis. This would require an emergency surgery to decompress, and there are no guarantees that the patient would recover from the paralysis. 4. Discitis-This is an infection of the intervertebral discs.  It occurs in about 1% of discography procedures.  It is difficult to treat and it may lead to surgery.        2. Pain: the needles have to go through skin and soft tissues, will cause soreness.       3. Damage to internal structures:  The nerves to be lesioned may be near blood vessels or    other nerves which can be potentially damaged.       4. Bleeding: Bleeding is more common if the patient is taking blood thinners such as  aspirin, Coumadin, Ticiid, Plavix, etc., or if he/she have some genetic predisposition  such as hemophilia. Bleeding into the spinal canal can cause compression of the spinal  cord with subsequent paralysis.  This would require an  emergency surgery to  decompress and there are no guarantees that the patient would recover from the  paralysis.       5. Pneumothorax:  Puncturing of a lung is a possibility, every time a needle is introduced in  the area of the chest or upper back.  Pneumothorax refers to free air around the  collapsed lung(s), inside of the thoracic cavity (chest cavity).  Another two possible  complications related to a similar event would include: Hemothorax and Chylothorax.   These are variations of the Pneumothorax, where instead of air around the collapsed  lung(s), you may have blood or chyle, respectively.       6. Spinal headaches: They may occur with any procedures in the area of the spine.       7. Persistent CSF (Cerebro-Spinal Fluid) leakage: This is a rare problem, but may occur  with prolonged intrathecal or epidural catheters either due to the formation of a fistulous  track or a dural tear.       8. Nerve damage: By working so close to the spinal cord, there is always a possibility of  nerve damage, which could be as serious as a permanent spinal cord injury with  paralysis.       9. Death:  Although rare, severe deadly allergic reactions known as "Anaphylactic  reaction" can occur to any of the medications used.      10. Worsening of the symptoms:  We can always make thing worse.    What are the chances of something like this happening? Chances of any of this occuring are extremely low.  By statistics, you have more of a chance of getting killed in a motor vehicle accident: while driving to the hospital than any of the above occurring .  Nevertheless, you should be aware that they are possibilities.  In general, it is similar to taking a shower.  Everybody knows that you can slip, hit your head and get killed.  Does that mean that you should not shower again?  Nevertheless always keep in mind that statistics do not mean anything if you happen to be on the wrong side of them.  Even if a procedure has a 1  (one) in a 1,000,000 (million) chance of going wrong, it you happen to be that one..Also, keep in mind that by statistics, you have more of a chance of having something go wrong when taking medications.  Who should not have this procedure? If you are on a blood thinning medication (e.g. Coumadin, Plavix, see list of "Blood Thinners"), or if you have an active infection going on, you should not have the procedure.  If you are taking any blood thinners, please inform your physician.  How should I prepare for this procedure?  Do not eat or drink anything at least six hours prior to the procedure.  Bring a driver with you .  It cannot be a taxi.  Come accompanied by an adult that can drive you back, and that is strong enough to help you if your legs get weak or numb from the local anesthetic.  Take all of your medicines the morning of the procedure with just enough water to swallow them.  If you have diabetes, make sure that you are scheduled to have your procedure done first thing in the morning, whenever possible.  If you have diabetes, take only half of your insulin dose and notify our nurse that you have done so as soon as you arrive at the clinic.  If you are diabetic, but only take blood sugar pills (oral hypoglycemic), then do not take them on the morning of your procedure.  You may take them after you have had the procedure.  Do not take aspirin or any aspirin-containing medications, at least eleven (11) days prior to the procedure.  They may prolong bleeding.  Wear loose fitting clothing that may be easy to take off and that you would not mind if it got stained with Betadine or blood.  Do not wear any jewelry or perfume  Remove any nail coloring.  It will interfere with some of our monitoring equipment.  NOTE: Remember that this is not meant to be interpreted as a complete list of all possible complications.  Unforeseen problems may occur.  BLOOD THINNERS The following drugs  contain aspirin or other products, which can cause increased bleeding during surgery and should not be taken for 2 weeks prior to and 1 week after surgery.  If you should need take something for relief of minor pain, you may take acetaminophen which is found in Tylenol,m Datril, Anacin-3 and Panadol. It is not blood thinner. The products listed below are.  Do not take any of the products listed below in addition to any listed on your instruction sheet.  A.P.C or A.P.C with Codeine Codeine Phosphate Capsules #3 Ibuprofen Ridaura  ABC compound Congesprin Imuran rimadil  Advil Cope Indocin Robaxisal  Alka-Seltzer Effervescent Pain Reliever and Antacid Coricidin or Coricidin-D  Indomethacin Rufen    Alka-Seltzer plus Cold Medicine Cosprin Ketoprofen S-A-C Tablets  Anacin Analgesic Tablets or Capsules Coumadin Korlgesic Salflex  Anacin Extra Strength Analgesic tablets or capsules CP-2 Tablets Lanoril Salicylate  Anaprox Cuprimine Capsules Levenox Salocol  Anexsia-D Dalteparin Magan Salsalate  Anodynos Darvon compound Magnesium Salicylate Sine-off  Ansaid Dasin Capsules Magsal Sodium Salicylate  Anturane Depen Capsules Marnal Soma  APF Arthritis pain formula Dewitt's Pills Measurin Stanback  Argesic Dia-Gesic Meclofenamic Sulfinpyrazone  Arthritis Bayer Timed Release Aspirin Diclofenac Meclomen Sulindac  Arthritis pain formula Anacin Dicumarol Medipren Supac  Analgesic (Safety coated) Arthralgen Diffunasal Mefanamic Suprofen  Arthritis Strength Bufferin Dihydrocodeine Mepro Compound Suprol  Arthropan liquid Dopirydamole Methcarbomol with Aspirin Synalgos  ASA tablets/Enseals Disalcid Micrainin Tagament  Ascriptin Doan's Midol Talwin  Ascriptin A/D Dolene Mobidin Tanderil  Ascriptin Extra Strength Dolobid Moblgesic Ticlid  Ascriptin with Codeine Doloprin or Doloprin with Codeine Momentum Tolectin  Asperbuf Duoprin Mono-gesic Trendar  Aspergum Duradyne Motrin or Motrin IB Triminicin  Aspirin  plain, buffered or enteric coated Durasal Myochrisine Trigesic  Aspirin Suppositories Easprin Nalfon Trillsate  Aspirin with Codeine Ecotrin Regular or Extra Strength Naprosyn Uracel  Atromid-S Efficin Naproxen Ursinus  Auranofin Capsules Elmiron Neocylate Vanquish  Axotal Emagrin Norgesic Verin  Azathioprine Empirin or Empirin with Codeine Normiflo Vitamin E  Azolid Emprazil Nuprin Voltaren  Bayer Aspirin plain, buffered or children's or timed BC Tablets or powders Encaprin Orgaran Warfarin Sodium  Buff-a-Comp Enoxaparin Orudis Zorpin  Buff-a-Comp with Codeine Equegesic Os-Cal-Gesic   Buffaprin Excedrin plain, buffered or Extra Strength Oxalid   Bufferin Arthritis Strength Feldene Oxphenbutazone   Bufferin plain or Extra Strength Feldene Capsules Oxycodone with Aspirin   Bufferin with Codeine Fenoprofen Fenoprofen Pabalate or Pabalate-SF   Buffets II Flogesic Panagesic   Buffinol plain or Extra Strength Florinal or Florinal with Codeine Panwarfarin   Buf-Tabs Flurbiprofen Penicillamine   Butalbital Compound Four-way cold tablets Penicillin   Butazolidin Fragmin Pepto-Bismol   Carbenicillin Geminisyn Percodan   Carna Arthritis Reliever Geopen Persantine   Carprofen Gold's salt Persistin   Chloramphenicol Goody's Phenylbutazone   Chloromycetin Haltrain Piroxlcam   Clmetidine heparin Plaquenil   Cllnoril Hyco-pap Ponstel   Clofibrate Hydroxy chloroquine Propoxyphen         Before stopping any of these medications, be sure to consult the physician who ordered them.  Some, such as Coumadin (Warfarin) are ordered to prevent or treat serious conditions such as "deep thrombosis", "pumonary embolisms", and other heart problems.  The amount of time that you may need off of the medication may also vary with the medication and the reason for which you were taking it.  If you are taking any of these medications, please make sure you notify your pain physician before you undergo any  procedures.         Lumbar Sympathetic Block Patient Information  Description: The lumbar plexus is a group of nerves that are part of the sympathetic nervous system.  These nerves supply organs in the pelvis and legs.  Lumbar sympathetic blocks are utilized for the diagnosis and treatment of painful conditions in these areas.   The lumbar plexus is located on both sides of the aorta at approximately the level of the second lumbar vertebral body.  The block will be performed with you lying on your abdomen with a pillow underneath.  Using direct x-ray guidance,   The plexus will be located on both sides of the spine.  Numbing medicine will be used to deaden the skin prior to needle insertion.  In most   cases, a small amount of sedation can be give by IV prior to the numbing medicine.  One or two small needles will be placed near the plexus and local anesthetic will be injected.  This may make your leg(s) feel warm.  The Entire block usually lasts about 15-25 minutes.  Conditions which may be treated by lumbar sympathetic block:   Reflex sympathetic dystrophy  Phantom limb pain  Peripheral neuropathy  Peripheral vascular disease ( inadequate blood flow )  Cancer pain of pelvis, leg and kidney  Preparation for the injection:  1. Do note eat any solid food or diary products within 8 hours of your appointment. 2. You may drink clear liquids up to 3 hours before appointment.  Clear liquids include water, black coffee, juice or soda.  No milk or cream please. 3. You may take your regular medication, including pain medications, with a sip of water before you appointment.  Diabetics should hold regular insulin ( if taken separately ) and take 1/2 NPH dose the morning of the procedure .  Carry some sugar containing items with you to your appointment. 4. A driver must accompany you and be prepared to drive you home after your procedure. 5. Bring all your current medication with you. 6. An IV  may be inserted and sedation may be given at the discretion of the physician.  7. A blood pressure cuff, EKG and other monitors will often be applied during the procedure.  Some patients may need to have extra oxygen administered for a short period. 8. You will be asked to provide medical information, including your allergies and medications, prior to the procedure.  We must know immediately if your taking blood thinners (like Coumadin/Warfarin) or if you are allergic to IV iodine contrast (dye).  We must know if you could possibly be pregnant.  Possible side-effects   Bleeding from needle site or deeper  Infection (rare, can require surgery)  Nerve injury (rare)  Numbness & tingling (temporary)  Collapsed lung (rare)  Spinal headache (a headache worse with upright posture)  Light-headedness (temporary)  Pain at injection site (several days)  Decreased blood pressure (temporary)  Weakness in legs (temporary)  Seizure or other drug reaction (rare)  Call if you experience:   Fever/chills associated with headache or increased back/ neck pain  Headache worsened by an upright position  New onset weakness or numbness of an extremity below the injection site  Hives or difficulty breathing ( go to the emergency room)  Inflammation or drainage at the injections site(s)  New symptoms which are concerning to you  Please note:  If effective, we will often do a series of 2-3 injections spaced 3-6 weeks apart to maximally decrease your pain.  If initial series is effective, you may be a candidate for a more permanent block of the lumbar sympathetic plexus.  If you have any questions please call [phone] [facility]  Script in hand for oxycodone x 3

## 2016-10-01 NOTE — Progress Notes (Signed)
Nursing Pain Medication Assessment:  Safety precautions to be maintained throughout the outpatient stay will include: orient to surroundings, keep bed in low position, maintain call bell within reach at all times, provide assistance with transfer out of bed and ambulation.  Medication Inspection Compliance: Pill count conducted under aseptic conditions, in front of the patient. Neither the pills nor the bottle was removed from the patient's sight at any time. Once count was completed pills were immediately returned to the patient in their original bottle. Pill Count: 16 of 120 pills remain Bottle Appearance: Standard pharmacy container. Clearly labeled. Medication: Oxycodone IR Filled Date: 5611 / 16 / 2017

## 2016-10-05 LAB — TOXASSURE SELECT 13 (MW), URINE

## 2016-12-19 ENCOUNTER — Encounter: Payer: Self-pay | Admitting: Pain Medicine

## 2016-12-19 ENCOUNTER — Other Ambulatory Visit
Admission: RE | Admit: 2016-12-19 | Discharge: 2016-12-19 | Disposition: A | Discharge status: Home / Self Care | Payer: Self-pay | Source: Ambulatory Visit | Attending: Pain Medicine | Admitting: Pain Medicine

## 2016-12-19 ENCOUNTER — Ambulatory Visit: Payer: Self-pay | Attending: Pain Medicine | Admitting: Pain Medicine

## 2016-12-19 VITALS — BP 124/80 | HR 67 | Temp 98.0°F | Resp 16 | Ht 67.0 in | Wt 180.0 lb

## 2016-12-19 DIAGNOSIS — M25559 Pain in unspecified hip: Secondary | ICD-10-CM

## 2016-12-19 DIAGNOSIS — M545 Low back pain: Secondary | ICD-10-CM

## 2016-12-19 DIAGNOSIS — M79641 Pain in right hand: Secondary | ICD-10-CM | POA: Insufficient documentation

## 2016-12-19 DIAGNOSIS — G894 Chronic pain syndrome: Secondary | ICD-10-CM | POA: Insufficient documentation

## 2016-12-19 DIAGNOSIS — K589 Irritable bowel syndrome without diarrhea: Secondary | ICD-10-CM | POA: Insufficient documentation

## 2016-12-19 DIAGNOSIS — K219 Gastro-esophageal reflux disease without esophagitis: Secondary | ICD-10-CM | POA: Insufficient documentation

## 2016-12-19 DIAGNOSIS — M25561 Pain in right knee: Secondary | ICD-10-CM | POA: Insufficient documentation

## 2016-12-19 DIAGNOSIS — M25552 Pain in left hip: Secondary | ICD-10-CM | POA: Insufficient documentation

## 2016-12-19 DIAGNOSIS — M722 Plantar fascial fibromatosis: Secondary | ICD-10-CM | POA: Insufficient documentation

## 2016-12-19 DIAGNOSIS — G8929 Other chronic pain: Secondary | ICD-10-CM

## 2016-12-19 DIAGNOSIS — M792 Neuralgia and neuritis, unspecified: Secondary | ICD-10-CM

## 2016-12-19 DIAGNOSIS — M25562 Pain in left knee: Secondary | ICD-10-CM | POA: Insufficient documentation

## 2016-12-19 DIAGNOSIS — Z79891 Long term (current) use of opiate analgesic: Secondary | ICD-10-CM | POA: Insufficient documentation

## 2016-12-19 DIAGNOSIS — F1190 Opioid use, unspecified, uncomplicated: Secondary | ICD-10-CM

## 2016-12-19 DIAGNOSIS — Z87891 Personal history of nicotine dependence: Secondary | ICD-10-CM | POA: Insufficient documentation

## 2016-12-19 DIAGNOSIS — M542 Cervicalgia: Secondary | ICD-10-CM | POA: Insufficient documentation

## 2016-12-19 DIAGNOSIS — M79672 Pain in left foot: Secondary | ICD-10-CM

## 2016-12-19 DIAGNOSIS — M25551 Pain in right hip: Secondary | ICD-10-CM | POA: Insufficient documentation

## 2016-12-19 LAB — COMPREHENSIVE METABOLIC PANEL
ALBUMIN: 4.4 g/dL (ref 3.5–5.0)
ALT: 17 U/L (ref 14–54)
ANION GAP: 7 (ref 5–15)
AST: 21 U/L (ref 15–41)
Alkaline Phosphatase: 107 U/L (ref 38–126)
BILIRUBIN TOTAL: 0.4 mg/dL (ref 0.3–1.2)
BUN: 15 mg/dL (ref 6–20)
CO2: 29 mmol/L (ref 22–32)
Calcium: 9.6 mg/dL (ref 8.9–10.3)
Chloride: 103 mmol/L (ref 101–111)
Creatinine, Ser: 0.85 mg/dL (ref 0.44–1.00)
GFR calc Af Amer: >60 mL/min (ref >60)
GFR calc non Af Amer: >60 mL/min (ref >60)
GLUCOSE: 98 mg/dL (ref 65–99)
POTASSIUM: 4.6 mmol/L (ref 3.5–5.1)
SODIUM: 139 mmol/L (ref 135–145)
TOTAL PROTEIN: 7.8 g/dL (ref 6.5–8.1)

## 2016-12-19 LAB — VITAMIN B12: VITAMIN B 12: 315 pg/mL (ref 180–914)

## 2016-12-19 LAB — SEDIMENTATION RATE: SED RATE: 10 mm/h (ref 0–30)

## 2016-12-19 LAB — MAGNESIUM: Magnesium: 2.1 mg/dL (ref 1.7–2.4)

## 2016-12-19 LAB — C-REACTIVE PROTEIN: CRP: <0.8 mg/dL (ref <1.0)

## 2016-12-19 MED ORDER — GABAPENTIN 800 MG PO TABS: 800.0000 mg | ORAL_TABLET | Freq: Four times a day (QID) | ORAL | 0 refills | Status: DC

## 2016-12-19 MED ORDER — OXYCODONE HCL 5 MG PO TABS: 5.0000 mg | ORAL_TABLET | Freq: Four times a day (QID) | ORAL | 0 refills | Status: DC | PRN

## 2016-12-19 MED ORDER — GABAPENTIN 100 MG PO CAPS: 100.0000 mg | ORAL_CAPSULE | Freq: Four times a day (QID) | ORAL | 0 refills | Status: DC

## 2016-12-19 NOTE — Progress Notes (Signed)
Patient's Name: Margaret Diaz  MRN: [id number]  Referring Provider: Maryland Pink, MD  DOB: 01-29-1966  PCP: Maryland Pink, MD  DOS: 12/19/2016  Note by: Kathlen Brunswick. Dossie Arbour, MD  Service setting: Ambulatory outpatient  Specialty: Interventional Pain Management  Location: [facility] (AMB) Pain Management Facility    Patient type: Established   Primary Reason(s) for Visit: Encounter for prescription drug management (Level of risk: moderate) CC: Neck Pain; Knee Pain; Hip Pain (bilatereal); and Foot Pain (bilateral)  HPI  Margaret Diaz is a 51 y.o. year old, female patient, who comes today for a medication management evaluation. She has Dysuria; Long term current use of opiate analgesic; Long term prescription opiate use; Opiate use (30 MME/day); Opiate dependence ([place]); Encounter for therapeutic drug level monitoring; Encounter for chronic pain management; Opioid-induced constipation (OIC); Neurogenic pain; Neuropathic pain; Generalized pain; Irritable bowel syndrome; History of bilateral breast implants; GERD (gastroesophageal reflux disease); Peptic ulcer disease; Arthritis associated with inflammatory bowel disease; Plantar fasciitis (Location of Primary Source of Pain) (Bilateral) (R>L); Chronic hip pain (Location of Secondary source of pain) (Bilateral) (L>R); Chronic knee pain (Location of Tertiary source of pain) (Bilateral) (L>R); Chronic neck pain (Right); Chronic low back pain (Bilateral) (L>R); Chronic hand pain (Bilateral) (R>L); Elevated sedimentation rate; Chronic pain syndrome; and Chronic feet pain (Location of Primary Source of Pain) (Bilateral) (L>R) (Burning) on her problem list. Her primarily concern today is the Neck Pain; Knee Pain; Hip Pain (bilatereal); and Foot Pain (bilateral)  Pain Assessment: Self-Reported Pain Score: 5 /10 Clinically the patient looks like a 2/10 Reported level is inconsistent with clinical observations. Information on the proper use of the pain scale provided to the  patient today Pain Type: Chronic pain Pain Location: Knee Pain Orientation: Right, Left Pain Descriptors / Indicators:  ("tearing or ripping sensation") Pain Frequency: Intermittent  Margaret Diaz was last scheduled for an appointment on 10/01/2016 for medication management. During today's appointment we reviewed Margaret Diaz's chronic pain status, as well as her outpatient medication regimen.  The patient  reports that she does not use drugs. Her body mass index is 28.19 kg/m.  Further details on both, my assessment(s), as well as the proposed treatment plan, please see below.  Controlled Substance Pharmacotherapy Assessment REMS (Risk Evaluation and Mitigation Strategy)  Analgesic:Oxycodone IR 5 mg every 6 hours (20 mg/day) MME/day:30 mg/day Landis Martins, RN  12/19/2016  8:53 AM  Sign at close encounter Nursing Pain Medication Assessment:  Safety precautions to be maintained throughout the outpatient stay will include: orient to surroundings, keep bed in low position, maintain call bell within reach at all times, provide assistance with transfer out of bed and ambulation.  Medication Inspection Compliance: Pill count conducted under aseptic conditions, in front of the patient. Neither the pills nor the bottle was removed from the patient's sight at any time. Once count was completed pills were immediately returned to the patient in their original bottle.  Medication: Oxycodone IR Pill/Patch Count: 60 of 120 pills remain Bottle Appearance: Standard pharmacy container. Clearly labeled. Filled Date: 02 / 14 / 2018 Last Medication intake:  Yesterday   Pharmacokinetics: Liberation and absorption (onset of action): WNL Distribution (time to peak effect): WNL Metabolism and excretion (duration of action): WNL         Pharmacodynamics: Desired effects: Analgesia: Margaret Diaz reports >50% benefit. Functional ability: Patient reports that medication allows her to accomplish basic  ADLs Clinically meaningful improvement in function (CMIF): Sustained CMIF goals met Perceived effectiveness: Described as relatively  effective, allowing for increase in activities of daily living (ADL) Undesirable effects: Side-effects or Adverse reactions: None reported Monitoring: [place] PMP: Online review of the past 50-monthperiod conducted. Compliant with practice rules and regulations List of all UDS test(s) done:  Lab Results  Component Value Date   TOXASSSELUR FINAL 10/01/2016   [place] 06/26/2016   [place]FINAL 04/03/2016   TK[place]FINAL 01/03/2016   TS[place]FINAL 12/06/2015   TOXASSSELUR FINAL 09/11/2015   Last UDS on record: ToxAssure Select 13  Date Value Ref Range Status  10/01/2016 FINAL  Final    Comment:    ==================================================================== TOXASSURE SELECT 13 (MW) ==================================================================== Specimen Alert Note:  Urinary creatinine is low; ability to detect some drugs may be compromised.  Interpret results with caution. ==================================================================== Test                             Result       Flag       Units Drug Present and Declared for Prescription Verification   Desmethyldiazepam              1614         EXPECTED   ng/mg creat   Oxazepam                       4800         EXPECTED   ng/mg creat   Temazepam                      3657         EXPECTED   ng/mg creat    Desmethyldiazepam, oxazepam, and temazepam are expected    metabolites of diazepam. Desmethyldiazepam and oxazepam are also    expected metabolites of other drugs, including chlordiazepoxide,    prazepam, clorazepate, and halazepam. Oxazepam is an expected    metabolite of temazepam. Oxazepam and temazepam are also    available as scheduled prescription medications.   Noroxycodone                   1607         EXPECTED   ng/mg creat    Noroxycodone is an  expected metabolite of oxycodone. Sources of    oxycodone include scheduled prescription medications. Drug Absent but Declared for Prescription Verification   Oxycodone                      Not Detected UNEXPECTED ng/mg creat    Oxycodone is almost always present in patients taking this drug    consistently.  Absence of oxycodone could be due to lapse of time    since the last dose or unusual pharmacokinetics (rapid    metabolism). ==================================================================== Test                      Result    Flag   Units      Ref Range   Creatinine              14        L      mg/dL      >=20 ==================================================================== Declared Medications:  The flagging and interpretation on this report are based on the  following declared medications.  Unexpected results may arise from  inaccuracies in the declared medications.  **Note: The testing scope of this panel  includes these medications:  Diazepam  Oxycodone  **Note: The testing scope of this panel does not include following  reported medications:  Bupropion  Diphenhydramine (Benadryl)  Gabapentin  Gabapentin (Neurontin)  Omeprazole  Quetiapine (Seroquel)  Sucralfate (Carafate)  Triamcinolone (Kenalog) ==================================================================== For clinical consultation, please call [phone]. ====================================================================    UDS interpretation: Unexpected findings: Absence of the parent compound in the presence of its metabolites could be due to lapse of time since the last dose or unusual pharmacokinetics (Rapid Metabolism). In addition, the patient was warned about the use of benzodiazepines in combination with opioids. Medication Assessment Form: Reviewed. Patient indicates being compliant with therapy Treatment compliance: Compliant Risk Assessment Profile: Aberrant behavior: See prior  evaluations. None observed or detected today Comorbid factors increasing risk of overdose: See prior notes. No additional risks detected today Risk of substance use disorder (SUD): Low Opioid Risk Tool (ORT) Total Score: 0  Interpretation Table:  Score <3 = Low Risk for SUD  Score between 4-7 = Moderate Risk for SUD  Score >8 = High Risk for Opioid Abuse   Risk Mitigation Strategies:  Patient Counseling: Covered Patient-Prescriber Agreement (PPA): Present and active  Notification to other healthcare providers: Done  Pharmacologic Plan: No change in therapy, at this time  Laboratory Chemistry  Inflammation Markers Lab Results  Component Value Date   ESRSEDRATE 23 (H) 12/06/2015   CRP <0.5 12/06/2015   Renal Function Markers Lab Results  Component Value Date   BUN 12 12/06/2015   CREATININE 0.61 12/06/2015   GFRAA >60 12/06/2015   GFRNONAA >60 12/06/2015   Hepatic Function Markers Lab Results  Component Value Date   AST 22 12/06/2015   ALT 18 12/06/2015   ALBUMIN 4.3 12/06/2015   ALKPHOS 105 12/06/2015   Electrolytes Lab Results  Component Value Date   NA 138 12/06/2015   K 3.9 12/06/2015   CL 107 12/06/2015   CALCIUM 9.0 12/06/2015   MG 2.1 12/06/2015   Neuropathy Markers No results found for: [id number] Bone Pathology Markers Lab Results  Component Value Date   ALKPHOS 105 12/06/2015   CALCIUM 9.0 12/06/2015   Coagulation Parameters Lab Results  Component Value Date   PLT 249 06/30/2015   Cardiovascular Markers Lab Results  Component Value Date   BNP 86 09/13/2014   HGB 12.8 06/30/2015   HCT 39.2 06/30/2015   Note: Lab results reviewed.  Recent Diagnostic Imaging Review  Ct Abdomen Pelvis W Contrast Result Date: 06/30/2015 CLINICAL DATA:  Possible bowel perforation. Abdominal pain in difficulty voiding EXAM: CT ABDOMEN AND PELVIS WITH CONTRAST TECHNIQUE: Multidetector CT imaging of the abdomen and pelvis was performed using the standard protocol  following bolus administration of intravenous contrast. CONTRAST:  145m OMNIPAQUE IOHEXOL 300 MG/ML SOLN, 276mOMNIPAQUE IOHEXOL 240 MG/ML SOLN COMPARISON:  03/30/2015 FINDINGS: Lower chest: There is a small right pleural effusion identified. Subsegmental atelectasis is noted in the lung bases. Hepatobiliary: There is no suspicious liver abnormality. The gallbladder is normal. No biliary dilatation. Pancreas: Negative. Spleen: Negative. Adrenals/Urinary Tract: The adrenal glands are negative. Unremarkable appearance of the kidneys. The urinary bladder appears normal. Stomach/Bowel: The stomach is within normal limits. The small bowel loops have a normal course and caliber. No obstruction. Normal appearance of the colon. The appendix is visualized and appears normal. Vascular/Lymphatic: Calcified atherosclerotic disease involves the abdominal aorta. No aneurysm. No enlarged retroperitoneal or mesenteric adenopathy. No enlarged pelvic or inguinal lymph nodes. Reproductive: Previous hysterectomy.  There is no adnexal mass. Other: No  free fluid or fluid collection within the abdomen or pelvis. There is no free intraperitoneal air identified. Musculoskeletal: Question nondisplaced right eighth rib fracture, image number 26 of series 7. Correlate for any focal lateral rib pain on the right side. IMPRESSION: 1. No acute findings within the abdomen or pelvis. 2. Small right pleural effusion and bibasilar atelectasis. 3. Cannot rule out a nondisplaced fracture involving the lateral aspect of the right eighth rib. Correlate for any focal tenderness in this region. Electronically Signed   By: Kerby Moors M.D.   On: 06/30/2015 18:48   Note: Imaging results reviewed.          Meds  The patient has a current medication list which includes the following prescription(s): bupropion, diazepam, diphenhydramine, omeprazole, quetiapine, sucralfate, gabapentin, gabapentin, oxycodone, oxycodone, and oxycodone.  Current  Outpatient Prescriptions on File Prior to Visit  Medication Sig  . buPROPion (WELLBUTRIN SR) 150 MG 12 hr tablet Take 150 mg by mouth 2 (two) times daily.  . diazepam (VALIUM) 10 MG tablet TAKE 1 TABLET BY MOUTH TWICE DAILY  . diphenhydrAMINE (BENADRYL) 25 MG tablet Take 25 mg by mouth every 6 (six) hours as needed.  Marland Kitchen omeprazole (PRILOSEC) 40 MG capsule Take 40 mg by mouth 2 (two) times daily.  . QUEtiapine (SEROQUEL) 100 MG tablet Take 100 mg by mouth 2 (two) times daily.  . sucralfate (CARAFATE) 1 G tablet Take 1 tablet by mouth 2 (two) times daily.    No current facility-administered medications on file prior to visit.    ROS  Constitutional: Denies any fever or chills Gastrointestinal: No reported hemesis, hematochezia, vomiting, or acute GI distress Musculoskeletal: Denies any acute onset joint swelling, redness, loss of ROM, or weakness Neurological: No reported episodes of acute onset apraxia, aphasia, dysarthria, agnosia, amnesia, paralysis, loss of coordination, or loss of consciousness  Allergies  Margaret Diaz is allergic to erythromycin ethylsuccinate; erythromycin; hydromorphone; hydromorphone hcl; and meperidine.  PFSH  Drug: Margaret Diaz  reports that she does not use drugs. Alcohol:  reports that she does not drink alcohol. Tobacco:  reports that she quit smoking about 5 years ago. Her smoking use included Cigarettes. She has a 3.00 pack-year smoking history. She has never used smokeless tobacco. Medical:  has a past medical history of Arthritis; Dyspnea (05/10/2015); GERD (gastroesophageal reflux disease); bilateral breast implants; Irritable bowel syndrome (IBS); and Pleural effusion on right (04/10/2015). Family: family history includes Mental illness in her mother.  Past Surgical History:  Procedure Laterality Date  . ABDOMINAL HYSTERECTOMY    . BREAST ENHANCEMENT SURGERY  1999   Constitutional Exam  General appearance: Well nourished, well developed, and well hydrated.  In no apparent acute distress Vitals:   12/19/16 0843  BP: 124/80  Pulse: 67  Resp: 16  Temp: 98 F (36.7 C)  TempSrc: Oral  SpO2: 96%  Weight: 180 lb (81.6 kg)  Height: _0  (1.702 m)   BMI Assessment: Estimated body mass index is 28.19 kg/m as calculated from the following:   Height as of this encounter: _1  (1.702 m).   Weight as of this encounter: 180 lb (81.6 kg).  BMI interpretation table: BMI level Category Range association with higher incidence of chronic pain  <18 kg/m2 Underweight   18.5-24.9 kg/m2 Ideal body weight   25-29.9 kg/m2 Overweight Increased incidence by 20%  30-34.9 kg/m2 Obese (Class I) Increased incidence by 68%  35-39.9 kg/m2 Severe obesity (Class II) Increased incidence by 136%  >40 kg/m2 Extreme obesity (Class  III) Increased incidence by 254%   BMI Readings from Last 4 Encounters:  12/19/16 28.19 kg/m  10/01/16 29.05 kg/m  06/26/16 30.02 kg/m  04/03/16 28.98 kg/m   Wt Readings from Last 4 Encounters:  12/19/16 180 lb (81.6 kg)  10/01/16 180 lb (81.6 kg)  06/26/16 186 lb (84.4 kg)  04/03/16 185 lb (83.9 kg)  Psych/Mental status: Alert, oriented x 3 (person, place, & time)       Eyes: PERLA Respiratory: No evidence of acute respiratory distress  Cervical Spine Exam  Inspection: No masses, redness, or swelling Alignment: Symmetrical Functional ROM: Unrestricted ROM Stability: No instability detected Muscle strength & Tone: Functionally intact Sensory: Unimpaired Palpation: Non-contributory  Upper Extremity (UE) Exam    Side: Right upper extremity  Side: Left upper extremity  Inspection: No masses, redness, swelling, or asymmetry. No contractures  Inspection: No masses, redness, swelling, or asymmetry. No contractures  Functional ROM: Unrestricted ROM          Functional ROM: Unrestricted ROM          Muscle strength & Tone: Functionally intact  Muscle strength & Tone: Functionally intact  Sensory: Unimpaired  Sensory: Unimpaired    Palpation: Euthermic  Palpation: Euthermic  Specialized Test(s): Deferred         Specialized Test(s): Deferred          Thoracic Spine Exam  Inspection: No masses, redness, or swelling Alignment: Symmetrical Functional ROM: Unrestricted ROM Stability: No instability detected Sensory: Unimpaired Muscle strength & Tone: Functionally intact Palpation: Non-contributory  Lumbar Spine Exam  Inspection: No masses, redness, or swelling Alignment: Symmetrical Functional ROM: Unrestricted ROM Stability: No instability detected Muscle strength & Tone: Functionally intact Sensory: Unimpaired Palpation: Non-contributory Provocative Tests: Lumbar Hyperextension and rotation test: evaluation deferred today       Patrick's Maneuver: evaluation deferred today              Gait & Posture Assessment  Ambulation: Unassisted Gait: Relatively normal for age and body habitus Posture: WNL   Lower Extremity Exam    Side: Right lower extremity  Side: Left lower extremity  Inspection: No masses, redness, swelling, or asymmetry. No contractures  Inspection: No masses, redness, swelling, or asymmetry. No contractures  Functional ROM: Unrestricted ROM          Functional ROM: Unrestricted ROM          Muscle strength & Tone: Functionally intact  Muscle strength & Tone: Functionally intact  Sensory: Unimpaired  Sensory: Unimpaired  Palpation: No palpable anomalies  Palpation: No palpable anomalies   Assessment  Primary Diagnosis & Pertinent Problem List: The primary encounter diagnosis was Chronic pain syndrome. Diagnoses of Chronic feet pain (Location of Primary Source of Pain) (Bilateral) (L>R) (Burning), Chronic hip pain, unspecified laterality, Chronic knee pain (Location of Tertiary source of pain) (Bilateral) (L>R), Neurogenic pain, Chronic low back pain (Bilateral) (L>R), Long term prescription opiate use, and Opiate use (30 MME/day) were also pertinent to this visit.  Status Diagnosis   Controlled Controlled Controlled 1. Chronic pain syndrome   2. Chronic feet pain (Location of Primary Source of Pain) (Bilateral) (L>R) (Burning)   3. Chronic hip pain, unspecified laterality   4. Chronic knee pain (Location of Tertiary source of pain) (Bilateral) (L>R)   5. Neurogenic pain   6. Chronic low back pain (Bilateral) (L>R)   7. Long term prescription opiate use   8. Opiate use (30 MME/day)      Plan of Care  Pharmacotherapy (Medications Ordered):  Meds ordered this encounter  Medications  . oxyCODONE (OXY IR/ROXICODONE) 5 MG immediate release tablet    Sig: Take 1 tablet (5 mg total) by mouth every 6 (six) hours as needed for severe pain.    Dispense:  120 tablet    Refill:  0    Do not place this medication, or any other prescription from our practice, on "Automatic Refill". Patient may have prescription filled one day early if pharmacy is closed on scheduled refill date. Do not fill until: 01/03/17 To last until: 02/02/17  . oxyCODONE (OXY IR/ROXICODONE) 5 MG immediate release tablet    Sig: Take 1 tablet (5 mg total) by mouth every 6 (six) hours as needed for severe pain.    Dispense:  120 tablet    Refill:  0    Do not place this medication, or any other prescription from our practice, on "Automatic Refill". Patient may have prescription filled one day early if pharmacy is closed on scheduled refill date. Do not fill until: 02/02/17 To last until: 03/04/17  . oxyCODONE (OXY IR/ROXICODONE) 5 MG immediate release tablet    Sig: Take 1 tablet (5 mg total) by mouth every 6 (six) hours as needed for severe pain.    Dispense:  120 tablet    Refill:  0    Do not place this medication, or any other prescription from our practice, on "Automatic Refill". Patient may have prescription filled one day early if pharmacy is closed on scheduled refill date. Do not fill until: 03/04/17 To last until: 04/03/17  . gabapentin (NEURONTIN) 800 MG tablet    Sig: Take 1 tablet (800 mg  total) by mouth 4 (four) times daily.    Dispense:  360 tablet    Refill:  0    Do not add this medication to the electronic "Automatic Refill" notification system. Patient may have prescription filled one day early if pharmacy is closed on scheduled refill date.  . gabapentin (NEURONTIN) 100 MG capsule    Sig: Take 1 capsule (100 mg total) by mouth 4 (four) times daily.    Dispense:  360 capsule    Refill:  0    Do not place this medication, or any other prescription from our practice, on "Automatic Refill". Patient may have prescription filled one day early if pharmacy is closed on scheduled refill date.   New Prescriptions   No medications on file   Medications administered today: Margaret Diaz had no medications administered during this visit. Lab-work, procedure(s), and/or referral(s): Orders Placed This Encounter  Procedures  . LUMBAR SYMPATHETIC BLOCK  . Comprehensive metabolic panel  . C-reactive protein  . Magnesium  . Sedimentation rate  . Vitamin B12  . 25-Hydroxyvitamin D Lcms D2+D3   Imaging and/or referral(s): None  Interventional therapies: Planned, scheduled, and/or pending:   Diagnostic left LSB under fluoro & IV sedation. Waiting until she gets insurance.   Considering:   Diagnostic left LSB  Diagnostic bilateral intra-articular hipjoint injection  Possible bilateral hip joint radiofrequency ablation Diagnostic bilateral intra-articular knee joint injection Diagnostic bilateral genicular nerve block Possible diagnostic bilateral genicular nerve radiofrequency ablation  Diagnostic bilateral lumbar facet block Possible bilateral lumbar facet radiofrequencyablation   Diagnostic right-sided cervical epidural steroid injection Diagnostic right-sided cervical facet block Possible right-sided cervical facet radiofrequencyablation    Palliative PRN treatment(s):   Diagnostic left LSB  Diagnostic bilateral intra-articular hipjoint injection  Diagnostic  bilateral intra-articular knee joint injection Diagnostic bilateral genicular nerve block Diagnostic bilateral lumbar facet  block Diagnostic right-sided cervical epidural steroid injection Diagnostic right-sided cervical facet block   Provider-requested follow-up: Return in about 3 months (around 03/21/2017) for (Nurse Practitioner) Med-Mgmt, in addition, (PRN) procedure.  No future appointments. Primary Care Physician: Maryland Pink, MD Location: [facility] Note by: Alisan Dokes A. Dossie Arbour, M.D, DABA, DABAPM, DABPM, DABIPP, FIPP Date: 12/19/2016; Time: 10:10 AM  Pain Score Disclaimer: We use the NRS-11 scale. This is a self-reported, subjective measurement of pain severity with only modest accuracy. It is used primarily to identify changes within a particular patient. It must be understood that outpatient pain scales are significantly less accurate that those used for research, where they can be applied under ideal controlled circumstances with minimal exposure to variables. In reality, the score is likely to be a combination of pain intensity and pain affect, where pain affect describes the degree of emotional arousal or changes in action readiness caused by the sensory experience of pain. Factors such as social and work situation, setting, emotional state, anxiety levels, expectation, and prior pain experience may influence pain perception and show large inter-individual differences that may also be affected by time variables.  Patient instructions provided during this appointment: Patient Instructions   Rx for oxycodone 5 mg Immediate release  To begin filling on 01/03/17 x 3 months given to patient   Pain Score  Introduction: The pain score used by this practice is the Verbal Numerical Rating Scale (VNRS-11). This is an 11-point scale. It is for adults and children 10 years or older. There are significant differences in how the pain score is reported, used,  and applied. Forget everything you learned in the past and learn this scoring system.  General Information: The scale should reflect your current level of pain. Unless you are specifically asked for the level of your worst pain, or your average pain. If you are asked for one of these two, then it should be understood that it is over the past 24 hours.  Basic Activities of Daily Living (ADL): Personal hygiene, dressing, eating, transferring, and using restroom.  Instructions: Most patients tend to report their level of pain as a combination of two factors, their physical pain and their psychosocial pain. This last one is also known as "suffering" and it is reflection of how physical pain affects you socially and psychologically. From now on, report them separately. From this point on, when asked to report your pain level, report only your physical pain. Use the following table for reference.  Pain Clinic Pain Levels (0-5/10)  Pain Level Score Description  No Pain 0   Mild pain 1 Nagging, annoying, but does not interfere with basic activities of daily living (ADL). Patients are able to eat, bathe, get dressed, toileting (being able to get on and off the toilet and perform personal hygiene functions), transfer (move in and out of bed or a chair without assistance), and maintain continence (able to control bladder and bowel functions). Blood pressure and heart rate are unaffected. A normal heart rate for a healthy adult ranges from 60 to 100 bpm (beats per minute).   Mild to moderate pain 2 Noticeable and distracting. Impossible to hide from other people. More frequent flare-ups. Still possible to adapt and function close to normal. It can be very annoying and may have occasional stronger flare-ups. With discipline, patients may get used to it and adapt.   Moderate pain 3 Interferes significantly with activities of daily living (ADL). It becomes difficult to feed, bathe, get dressed,  get on and off the  toilet or to perform personal hygiene functions. Difficult to get in and out of bed or a chair without assistance. Very distracting. With effort, it can be ignored when deeply involved in activities.   Moderately severe pain 4 Impossible to ignore for more than a few minutes. With effort, patients may still be able to manage work or participate in some social activities. Very difficult to concentrate. Signs of autonomic nervous system discharge are evident: dilated pupils (mydriasis); mild sweating (diaphoresis); sleep interference. Heart rate becomes elevated (>115 bpm). Diastolic blood pressure (lower number) rises above 100 mmHg. Patients find relief in laying down and not moving.   Severe pain 5 Intense and extremely unpleasant. Associated with frowning face and frequent crying. Pain overwhelms the senses.  Ability to do any activity or maintain social relationships becomes significantly limited. Conversation becomes difficult. Pacing back and forth is common, as getting into a comfortable position is nearly impossible. Pain wakes you up from deep sleep. Physical signs will be obvious: pupillary dilation; increased sweating; goosebumps; brisk reflexes; cold, clammy hands and feet; nausea, vomiting or dry heaves; loss of appetite; significant sleep disturbance with inability to fall asleep or to remain asleep. When persistent, significant weight loss is observed due to the complete loss of appetite and sleep deprivation.  Blood pressure and heart rate becomes significantly elevated. Caution: If elevated blood pressure triggers a pounding headache associated with blurred vision, then the patient should immediately seek attention at an urgent or emergency care unit, as these may be signs of an impending stroke.    Emergency Department Pain Levels (6-10/10)  Emergency Room Pain 6 Severely limiting. Requires emergency care and should not be seen or managed at an outpatient pain management facility.  Communication becomes difficult and requires great effort. Assistance to reach the emergency department may be required. Facial flushing and profuse sweating along with potentially dangerous increases in heart rate and blood pressure will be evident.   Distressing pain 7 Self-care is very difficult. Assistance is required to transport, or use restroom. Assistance to reach the emergency department will be required. Tasks requiring coordination, such as bathing and getting dressed become very difficult.   Disabling pain 8 Self-care is no longer possible. At this level, pain is disabling. The individual is unable to do even the most "basic" activities such as walking, eating, bathing, dressing, transferring to a bed, or toileting. Fine motor skills are lost. It is difficult to think clearly.   Incapacitating pain 9 Pain becomes incapacitating. Thought processing is no longer possible. Difficult to remember your own name. Control of movement and coordination are lost.   The worst pain imaginable 10 At this level, most patients pass out from pain. When this level is reached, collapse of the autonomic nervous system occurs, leading to a sudden drop in blood pressure and heart rate. This in turn results in a temporary and dramatic drop in blood flow to the brain, leading to a loss of consciousness. Fainting is one of the body's self defense mechanisms. Passing out puts the brain in a calmed state and causes it to shut down for a while, in order to begin the healing process.    Summary: 1. Refer to this scale when providing [place] with your pain level. 2. Be accurate and careful when reporting your pain level. This will help with your care. 3. Over-reporting your pain level will lead to loss of credibility. 4. Even a level of 1/10 means that there  is pain and will be treated at our facility. 5. High, inaccurate reporting will be documented as "Symptom Exaggeration", leading to loss of credibility and suspicions  of possible secondary gains such as obtaining more narcotics, or wanting to appear disabled, for fraudulent reasons. 6. Only pain levels of 5 or below will be seen at our facility. 7. Pain levels of 6 and above will be sent to the Emergency Department and the appointment cancelled. _____________________________________________________________________________________________  Preparing for Procedure with Sedation Instructions: . Oral Intake: Do not eat or drink anything for at least 8 hours prior to your procedure. . Transportation: Public transportation is not allowed. Bring an adult driver. The driver must be physically present in our waiting room before any procedure can be started. Marland Kitchen Physical Assistance: Bring an adult physically capable of assisting you, in the event you need help. This adult should keep you company at home for at least 6 hours after the procedure. . Blood Pressure Medicine: Take your blood pressure medicine with a sip of water the morning of the procedure. . Blood thinners:  . Diabetics on insulin: Notify the staff so that you can be scheduled 1st case in the morning. If your diabetes requires high dose insulin, take only  of your normal insulin dose the morning of the procedure and notify the staff that you have done so. . Preventing infections: Shower with an antibacterial soap the morning of your procedure. . Build-up your immune system: Take 1000 mg of Vitamin C with every meal (3 times a day) the day prior to your procedure. Marland Kitchen Antibiotics: Inform the staff if you have a condition or reason that requires you to take antibiotics before dental procedures. . Pregnancy: If you are pregnant, call and cancel the procedure. . Sickness: If you have a cold, fever, or any active infections, call and cancel the procedure. . Arrival: You must be in the facility at least 30 minutes prior to your scheduled procedure. . Children: Do not bring children with you. . Dress appropriately:  Bring dark clothing that you would not mind if they get stained. . Valuables: Do not bring any jewelry or valuables. Procedure appointments are reserved for interventional treatments only. Marland Kitchen No Prescription Refills. . No medication changes will be discussed during procedure appointments. . No disability issues will be discussed.  ____________________________________________________________________________________________   Sympathetic Nerve Block, Care After These instructions provide you with information about caring for yourself after your procedure. Your health care provider may also give you more specific instructions. Your treatment has been planned according to current medical practices, but problems sometimes occur. Call your health care provider if you have any problems or questions after your procedure. What can I expect after the procedure? After your procedure, it is common for the area where the medicine was injected to be:  Sore.  Warm.  Weak.  Numb. If the injection was made in your neck, you may also have:  Voice changes.  A droopy eyelid.  Trouble swallowing.  A stuffy nose. Follow these instructions at home:  For the first 24 hours after your procedure:  Do not drive.  Rest.  Avoid activities that require a lot of energy.  Keep track of the amount of pain relief that you feel and how long it lasts.  Do not apply heat near or over the injection sites.  Do not take a bath or soak in water, such as in a pool or lake, until your health care provider approves.  Take over-the-counter and  prescription medicines only as told by your health care provider.  If you have trouble swallowing, take small bites when eating and small sips of water when drinking until you are able to swallow normally.  Keep all follow-up visits as told by your health care provider. This is important. Contact a health care provider if:  You have numbness that lasts longer than 8  hours.  You continue to have pain for more than 24 hours after your procedure.  You have worsening pain or swelling around an injection site.  There are red streaks around an injection site. Get help right away if:  You cannot swallow.  You have chest pain.  You have trouble breathing. This information is not intended to replace advice given to you by your health care provider. Make sure you discuss any questions you have with your health care provider. Document Released: 02/21/2014 Document Revised: 03/15/2016 Document Reviewed: 02/01/2016 Elsevier Interactive Patient Education  2017 [employer]  What are the risk, side effects and possible complications? Generally speaking, most procedures are safe.  However, with any procedure there are risks, side effects, and the possibility of complications.  The risks and complications are dependent upon the sites that are lesioned, or the type of nerve block to be performed.  The closer the procedure is to the spine, the more serious the risks are.  Great care is taken when placing the radio frequency needles, block needles or lesioning probes, but sometimes complications can occur. 1. Infection: Any time there is an injection through the skin, there is a risk of infection.  This is why sterile conditions are used for these blocks.  There are four possible types of infection. 1. Localized skin infection. 2. Central Nervous System Infection-This can be in the form of Meningitis, which can be deadly. 3. Epidural Infections-This can be in the form of an epidural abscess, which can cause pressure inside of the spine, causing compression of the spinal cord with subsequent paralysis. This would require an emergency surgery to decompress, and there are no guarantees that the patient would recover from the paralysis. 4. Discitis-This is an infection of the intervertebral discs.  It occurs in about 1% of discography  procedures.  It is difficult to treat and it may lead to surgery.        2. Pain: the needles have to go through skin and soft tissues, will cause soreness.       3. Damage to internal structures:  The nerves to be lesioned may be near blood vessels or    other nerves which can be potentially damaged.       4. Bleeding: Bleeding is more common if the patient is taking blood thinners such as  aspirin, Coumadin, Ticiid, Plavix, etc., or if he/she have some genetic predisposition  such as hemophilia. Bleeding into the spinal canal can cause compression of the spinal  cord with subsequent paralysis.  This would require an emergency surgery to  decompress and there are no guarantees that the patient would recover from the  paralysis.       5. Pneumothorax:  Puncturing of a lung is a possibility, every time a needle is introduced in  the area of the chest or upper back.  Pneumothorax refers to free air around the  collapsed lung(s), inside of the thoracic cavity (chest cavity).  Another two possible  complications related to a similar event would include: Hemothorax and Chylothorax.   These are variations  of the Pneumothorax, where instead of air around the collapsed  lung(s), you may have blood or chyle, respectively.       6. Spinal headaches: They may occur with any procedures in the area of the spine.       7. Persistent CSF (Cerebro-Spinal Fluid) leakage: This is a rare problem, but may occur  with prolonged intrathecal or epidural catheters either due to the formation of a fistulous  track or a dural tear.       8. Nerve damage: By working so close to the spinal cord, there is always a possibility of  nerve damage, which could be as serious as a permanent spinal cord injury with  paralysis.       9. Death:  Although rare, severe deadly allergic reactions known as "Anaphylactic  reaction" can occur to any of the medications used.      10. Worsening of the symptoms:  We can always make thing worse.  What  are the chances of something like this happening? Chances of any of this occuring are extremely low.  By statistics, you have more of a chance of getting killed in a motor vehicle accident: while driving to the hospital than any of the above occurring .  Nevertheless, you should be aware that they are possibilities.  In general, it is similar to taking a shower.  Everybody knows that you can slip, hit your head and get killed.  Does that mean that you should not shower again?  Nevertheless always keep in mind that statistics do not mean anything if you happen to be on the wrong side of them.  Even if a procedure has a 1 (one) in a 1,000,000 (million) chance of going wrong, it you happen to be that one..Also, keep in mind that by statistics, you have more of a chance of having something go wrong when taking medications.  Who should not have this procedure? If you are on a blood thinning medication (e.g. Coumadin, Plavix, see list of "Blood Thinners"), or if you have an active infection going on, you should not have the procedure.  If you are taking any blood thinners, please inform your physician.  How should I prepare for this procedure?  Do not eat or drink anything at least six hours prior to the procedure.  Bring a driver with you .  It cannot be a taxi.  Come accompanied by an adult that can drive you back, and that is strong enough to help you if your legs get weak or numb from the local anesthetic.  Take all of your medicines the morning of the procedure with just enough water to swallow them.  If you have diabetes, make sure that you are scheduled to have your procedure done first thing in the morning, whenever possible.  If you have diabetes, take only half of your insulin dose and notify our nurse that you have done so as soon as you arrive at the clinic.  If you are diabetic, but only take blood sugar pills (oral hypoglycemic), then do not take them on the morning of your procedure.   You may take them after you have had the procedure.  Do not take aspirin or any aspirin-containing medications, at least eleven (11) days prior to the procedure.  They may prolong bleeding.  Wear loose fitting clothing that may be easy to take off and that you would not mind if it got stained with Betadine or blood.  Do not wear  any jewelry or perfume  Remove any nail coloring.  It will interfere with some of our monitoring equipment.  NOTE: Remember that this is not meant to be interpreted as a complete list of all possible complications.  Unforeseen problems may occur.  BLOOD THINNERS The following drugs contain aspirin or other products, which can cause increased bleeding during surgery and should not be taken for 2 weeks prior to and 1 week after surgery.  If you should need take something for relief of minor pain, you may take acetaminophen which is found in Tylenol,m Datril, Anacin-3 and Panadol. It is not blood thinner. The products listed below are.  Do not take any of the products listed below in addition to any listed on your instruction sheet.  A.P.C or A.P.C with Codeine Codeine Phosphate Capsules #3 Ibuprofen Ridaura  ABC compound Congesprin Imuran rimadil  Advil Cope Indocin Robaxisal  Alka-Seltzer Effervescent Pain Reliever and Antacid Coricidin or Coricidin-D  Indomethacin Rufen  Alka-Seltzer plus Cold Medicine Cosprin Ketoprofen S-A-C Tablets  Anacin Analgesic Tablets or Capsules Coumadin Korlgesic Salflex  Anacin Extra Strength Analgesic tablets or capsules CP-2 Tablets Lanoril Salicylate  Anaprox Cuprimine Capsules Levenox Salocol  Anexsia-D Dalteparin Magan Salsalate  Anodynos Darvon compound Magnesium Salicylate Sine-off  Ansaid Dasin Capsules Magsal Sodium Salicylate  Anturane Depen Capsules Marnal Soma  APF Arthritis pain formula Dewitt's Pills Measurin Stanback  Argesic Dia-Gesic Meclofenamic Sulfinpyrazone  Arthritis Bayer Timed Release Aspirin Diclofenac  Meclomen Sulindac  Arthritis pain formula Anacin Dicumarol Medipren Supac  Analgesic (Safety coated) Arthralgen Diffunasal Mefanamic Suprofen  Arthritis Strength Bufferin Dihydrocodeine Mepro Compound Suprol  Arthropan liquid Dopirydamole Methcarbomol with Aspirin Synalgos  ASA tablets/Enseals Disalcid Micrainin Tagament  Ascriptin Doan's Midol Talwin  Ascriptin A/D Dolene Mobidin Tanderil  Ascriptin Extra Strength Dolobid Moblgesic Ticlid  Ascriptin with Codeine Doloprin or Doloprin with Codeine Momentum Tolectin  Asperbuf Duoprin Mono-gesic Trendar  Aspergum Duradyne Motrin or Motrin IB Triminicin  Aspirin plain, buffered or enteric coated Durasal Myochrisine Trigesic  Aspirin Suppositories Easprin Nalfon Trillsate  Aspirin with Codeine Ecotrin Regular or Extra Strength Naprosyn Uracel  Atromid-S Efficin Naproxen Ursinus  Auranofin Capsules Elmiron Neocylate Vanquish  Axotal Emagrin Norgesic Verin  Azathioprine Empirin or Empirin with Codeine Normiflo Vitamin E  Azolid Emprazil Nuprin Voltaren  Bayer Aspirin plain, buffered or children's or timed BC Tablets or powders Encaprin Orgaran Warfarin Sodium  Buff-a-Comp Enoxaparin Orudis Zorpin  Buff-a-Comp with Codeine Equegesic Os-Cal-Gesic   Buffaprin Excedrin plain, buffered or Extra Strength Oxalid   Bufferin Arthritis Strength Feldene Oxphenbutazone   Bufferin plain or Extra Strength Feldene Capsules Oxycodone with Aspirin   Bufferin with Codeine Fenoprofen Fenoprofen Pabalate or Pabalate-SF   Buffets II Flogesic Panagesic   Buffinol plain or Extra Strength Florinal or Florinal with Codeine Panwarfarin   Buf-Tabs Flurbiprofen Penicillamine   Butalbital Compound Four-way cold tablets Penicillin   Butazolidin Fragmin Pepto-Bismol   Carbenicillin Geminisyn Percodan   Carna Arthritis Reliever Geopen Persantine   Carprofen Gold's salt Persistin   Chloramphenicol Goody's Phenylbutazone   Chloromycetin Haltrain Piroxlcam   Clmetidine  heparin Plaquenil   Cllnoril Hyco-pap Ponstel   Clofibrate Hydroxy chloroquine Propoxyphen         Before stopping any of these medications, be sure to consult the physician who ordered them.  Some, such as Coumadin (Warfarin) are ordered to prevent or treat serious conditions such as "deep thrombosis", "pumonary embolisms", and other heart problems.  The amount of time that you may need off of the medication may also  vary with the medication and the reason for which you were taking it.  If you are taking any of these medications, please make sure you notify your pain physician before you undergo any procedures.

## 2016-12-19 NOTE — Patient Instructions (Addendum)
Rx for oxycodone 5 mg Immediate release  To begin filling on 01/03/17 x 3 months given to patient   Pain Score  Introduction: The pain score used by this practice is the Verbal Numerical Rating Scale (VNRS-11). This is an 11-point scale. It is for adults and children 10 years or older. There are significant differences in how the pain score is reported, used, and applied. Forget everything you learned in the past and learn this scoring system.  General Information: The scale should reflect your current level of pain. Unless you are specifically asked for the level of your worst pain, or your average pain. If you are asked for one of these two, then it should be understood that it is over the past 24 hours.  Basic Activities of Daily Living (ADL): Personal hygiene, dressing, eating, transferring, and using restroom.  Instructions: Most patients tend to report their level of pain as a combination of two factors, their physical pain and their psychosocial pain. This last one is also known as "suffering" and it is reflection of how physical pain affects you socially and psychologically. From now on, report them separately. From this point on, when asked to report your pain level, report only your physical pain. Use the following table for reference.  Pain Clinic Pain Levels (0-5/10)  Pain Level Score Description  No Pain 0   Mild pain 1 Nagging, annoying, but does not interfere with basic activities of daily living (ADL). Patients are able to eat, bathe, get dressed, toileting (being able to get on and off the toilet and perform personal hygiene functions), transfer (move in and out of bed or a chair without assistance), and maintain continence (able to control bladder and bowel functions). Blood pressure and heart rate are unaffected. A normal heart rate for a healthy adult ranges from 60 to 100 bpm (beats per minute).   Mild to moderate pain 2 Noticeable and distracting. Impossible to hide from other  people. More frequent flare-ups. Still possible to adapt and function close to normal. It can be very annoying and may have occasional stronger flare-ups. With discipline, patients may get used to it and adapt.   Moderate pain 3 Interferes significantly with activities of daily living (ADL). It becomes difficult to feed, bathe, get dressed, get on and off the toilet or to perform personal hygiene functions. Difficult to get in and out of bed or a chair without assistance. Very distracting. With effort, it can be ignored when deeply involved in activities.   Moderately severe pain 4 Impossible to ignore for more than a few minutes. With effort, patients may still be able to manage work or participate in some social activities. Very difficult to concentrate. Signs of autonomic nervous system discharge are evident: dilated pupils (mydriasis); mild sweating (diaphoresis); sleep interference. Heart rate becomes elevated (>115 bpm). Diastolic blood pressure (lower number) rises above 100 mmHg. Patients find relief in laying down and not moving.   Severe pain 5 Intense and extremely unpleasant. Associated with frowning face and frequent crying. Pain overwhelms the senses.  Ability to do any activity or maintain social relationships becomes significantly limited. Conversation becomes difficult. Pacing back and forth is common, as getting into a comfortable position is nearly impossible. Pain wakes you up from deep sleep. Physical signs will be obvious: pupillary dilation; increased sweating; goosebumps; brisk reflexes; cold, clammy hands and feet; nausea, vomiting or dry heaves; loss of appetite; significant sleep disturbance with inability to fall asleep or to remain asleep. When  persistent, significant weight loss is observed due to the complete loss of appetite and sleep deprivation.  Blood pressure and heart rate becomes significantly elevated. Caution: If elevated blood pressure triggers a pounding headache  associated with blurred vision, then the patient should immediately seek attention at an urgent or emergency care unit, as these may be signs of an impending stroke.    Emergency Department Pain Levels (6-10/10)  Emergency Room Pain 6 Severely limiting. Requires emergency care and should not be seen or managed at an outpatient pain management facility. Communication becomes difficult and requires great effort. Assistance to reach the emergency department may be required. Facial flushing and profuse sweating along with potentially dangerous increases in heart rate and blood pressure will be evident.   Distressing pain 7 Self-care is very difficult. Assistance is required to transport, or use restroom. Assistance to reach the emergency department will be required. Tasks requiring coordination, such as bathing and getting dressed become very difficult.   Disabling pain 8 Self-care is no longer possible. At this level, pain is disabling. The individual is unable to do even the most "basic" activities such as walking, eating, bathing, dressing, transferring to a bed, or toileting. Fine motor skills are lost. It is difficult to think clearly.   Incapacitating pain 9 Pain becomes incapacitating. Thought processing is no longer possible. Difficult to remember your own name. Control of movement and coordination are lost.   The worst pain imaginable 10 At this level, most patients pass out from pain. When this level is reached, collapse of the autonomic nervous system occurs, leading to a sudden drop in blood pressure and heart rate. This in turn results in a temporary and dramatic drop in blood flow to the brain, leading to a loss of consciousness. Fainting is one of the body's self defense mechanisms. Passing out puts the brain in a calmed state and causes it to shut down for a while, in order to begin the healing process.    Summary: 1. Refer to this scale when providing [place] with your pain level. 2. Be  accurate and careful when reporting your pain level. This will help with your care. 3. Over-reporting your pain level will lead to loss of credibility. 4. Even a level of 1/10 means that there is pain and will be treated at our facility. 5. High, inaccurate reporting will be documented as "Symptom Exaggeration", leading to loss of credibility and suspicions of possible secondary gains such as obtaining more narcotics, or wanting to appear disabled, for fraudulent reasons. 6. Only pain levels of 5 or below will be seen at our facility. 7. Pain levels of 6 and above will be sent to the Emergency Department and the appointment cancelled. _____________________________________________________________________________________________  Preparing for Procedure with Sedation Instructions: . Oral Intake: Do not eat or drink anything for at least 8 hours prior to your procedure. . Transportation: Public transportation is not allowed. Bring an adult driver. The driver must be physically present in our waiting room before any procedure can be started. Marland Kitchen Physical Assistance: Bring an adult physically capable of assisting you, in the event you need help. This adult should keep you company at home for at least 6 hours after the procedure. . Blood Pressure Medicine: Take your blood pressure medicine with a sip of water the morning of the procedure. . Blood thinners:  . Diabetics on insulin: Notify the staff so that you can be scheduled 1st case in the morning. If your diabetes requires high dose insulin,  take only  of your normal insulin dose the morning of the procedure and notify the staff that you have done so. . Preventing infections: Shower with an antibacterial soap the morning of your procedure. . Build-up your immune system: Take 1000 mg of Vitamin C with every meal (3 times a day) the day prior to your procedure. Marland Kitchen Antibiotics: Inform the staff if you have a condition or reason that requires you to take  antibiotics before dental procedures. . Pregnancy: If you are pregnant, call and cancel the procedure. . Sickness: If you have a cold, fever, or any active infections, call and cancel the procedure. . Arrival: You must be in the facility at least 30 minutes prior to your scheduled procedure. . Children: Do not bring children with you. . Dress appropriately: Bring dark clothing that you would not mind if they get stained. . Valuables: Do not bring any jewelry or valuables. Procedure appointments are reserved for interventional treatments only. Marland Kitchen No Prescription Refills. . No medication changes will be discussed during procedure appointments. . No disability issues will be discussed.  ____________________________________________________________________________________________   Sympathetic Nerve Block, Care After These instructions provide you with information about caring for yourself after your procedure. Your health care provider may also give you more specific instructions. Your treatment has been planned according to current medical practices, but problems sometimes occur. Call your health care provider if you have any problems or questions after your procedure. What can I expect after the procedure? After your procedure, it is common for the area where the medicine was injected to be:  Sore.  Warm.  Weak.  Numb. If the injection was made in your neck, you may also have:  Voice changes.  A droopy eyelid.  Trouble swallowing.  A stuffy nose. Follow these instructions at home:  For the first 24 hours after your procedure:  Do not drive.  Rest.  Avoid activities that require a lot of energy.  Keep track of the amount of pain relief that you feel and how long it lasts.  Do not apply heat near or over the injection sites.  Do not take a bath or soak in water, such as in a pool or lake, until your health care provider approves.  Take over-the-counter and prescription  medicines only as told by your health care provider.  If you have trouble swallowing, take small bites when eating and small sips of water when drinking until you are able to swallow normally.  Keep all follow-up visits as told by your health care provider. This is important. Contact a health care provider if:  You have numbness that lasts longer than 8 hours.  You continue to have pain for more than 24 hours after your procedure.  You have worsening pain or swelling around an injection site.  There are red streaks around an injection site. Get help right away if:  You cannot swallow.  You have chest pain.  You have trouble breathing. This information is not intended to replace advice given to you by your health care provider. Make sure you discuss any questions you have with your health care provider. Document Released: 02/21/2014 Document Revised: 03/15/2016 Document Reviewed: 02/01/2016 Elsevier Interactive Patient Education  2017 [employer] GENERAL RISKS AND COMPLICATIONS  What are the risk, side effects and possible complications? Generally speaking, most procedures are safe.  However, with any procedure there are risks, side effects, and the possibility of complications.  The risks and complications are dependent upon the  sites that are lesioned, or the type of nerve block to be performed.  The closer the procedure is to the spine, the more serious the risks are.  Great care is taken when placing the radio frequency needles, block needles or lesioning probes, but sometimes complications can occur. 1. Infection: Any time there is an injection through the skin, there is a risk of infection.  This is why sterile conditions are used for these blocks.  There are four possible types of infection. 1. Localized skin infection. 2. Central Nervous System Infection-This can be in the form of Meningitis, which can be deadly. 3. Epidural Infections-This can be in the form of an epidural  abscess, which can cause pressure inside of the spine, causing compression of the spinal cord with subsequent paralysis. This would require an emergency surgery to decompress, and there are no guarantees that the patient would recover from the paralysis. 4. Discitis-This is an infection of the intervertebral discs.  It occurs in about 1% of discography procedures.  It is difficult to treat and it may lead to surgery.        2. Pain: the needles have to go through skin and soft tissues, will cause soreness.       3. Damage to internal structures:  The nerves to be lesioned may be near blood vessels or    other nerves which can be potentially damaged.       4. Bleeding: Bleeding is more common if the patient is taking blood thinners such as  aspirin, Coumadin, Ticiid, Plavix, etc., or if he/she have some genetic predisposition  such as hemophilia. Bleeding into the spinal canal can cause compression of the spinal  cord with subsequent paralysis.  This would require an emergency surgery to  decompress and there are no guarantees that the patient would recover from the  paralysis.       5. Pneumothorax:  Puncturing of a lung is a possibility, every time a needle is introduced in  the area of the chest or upper back.  Pneumothorax refers to free air around the  collapsed lung(s), inside of the thoracic cavity (chest cavity).  Another two possible  complications related to a similar event would include: Hemothorax and Chylothorax.   These are variations of the Pneumothorax, where instead of air around the collapsed  lung(s), you may have blood or chyle, respectively.       6. Spinal headaches: They may occur with any procedures in the area of the spine.       7. Persistent CSF (Cerebro-Spinal Fluid) leakage: This is a rare problem, but may occur  with prolonged intrathecal or epidural catheters either due to the formation of a fistulous  track or a dural tear.       8. Nerve damage: By working so close to the  spinal cord, there is always a possibility of  nerve damage, which could be as serious as a permanent spinal cord injury with  paralysis.       9. Death:  Although rare, severe deadly allergic reactions known as "Anaphylactic  reaction" can occur to any of the medications used.      10. Worsening of the symptoms:  We can always make thing worse.  What are the chances of something like this happening? Chances of any of this occuring are extremely low.  By statistics, you have more of a chance of getting killed in a motor vehicle accident: while driving to the hospital than any  of the above occurring .  Nevertheless, you should be aware that they are possibilities.  In general, it is similar to taking a shower.  Everybody knows that you can slip, hit your head and get killed.  Does that mean that you should not shower again?  Nevertheless always keep in mind that statistics do not mean anything if you happen to be on the wrong side of them.  Even if a procedure has a 1 (one) in a 1,000,000 (million) chance of going wrong, it you happen to be that one..Also, keep in mind that by statistics, you have more of a chance of having something go wrong when taking medications.  Who should not have this procedure? If you are on a blood thinning medication (e.g. Coumadin, Plavix, see list of "Blood Thinners"), or if you have an active infection going on, you should not have the procedure.  If you are taking any blood thinners, please inform your physician.  How should I prepare for this procedure?  Do not eat or drink anything at least six hours prior to the procedure.  Bring a driver with you .  It cannot be a taxi.  Come accompanied by an adult that can drive you back, and that is strong enough to help you if your legs get weak or numb from the local anesthetic.  Take all of your medicines the morning of the procedure with just enough water to swallow them.  If you have diabetes, make sure that you are  scheduled to have your procedure done first thing in the morning, whenever possible.  If you have diabetes, take only half of your insulin dose and notify our nurse that you have done so as soon as you arrive at the clinic.  If you are diabetic, but only take blood sugar pills (oral hypoglycemic), then do not take them on the morning of your procedure.  You may take them after you have had the procedure.  Do not take aspirin or any aspirin-containing medications, at least eleven (11) days prior to the procedure.  They may prolong bleeding.  Wear loose fitting clothing that may be easy to take off and that you would not mind if it got stained with Betadine or blood.  Do not wear any jewelry or perfume  Remove any nail coloring.  It will interfere with some of our monitoring equipment.  NOTE: Remember that this is not meant to be interpreted as a complete list of all possible complications.  Unforeseen problems may occur.  BLOOD THINNERS The following drugs contain aspirin or other products, which can cause increased bleeding during surgery and should not be taken for 2 weeks prior to and 1 week after surgery.  If you should need take something for relief of minor pain, you may take acetaminophen which is found in Tylenol,m Datril, Anacin-3 and Panadol. It is not blood thinner. The products listed below are.  Do not take any of the products listed below in addition to any listed on your instruction sheet.  A.P.C or A.P.C with Codeine Codeine Phosphate Capsules #3 Ibuprofen Ridaura  ABC compound Congesprin Imuran rimadil  Advil Cope Indocin Robaxisal  Alka-Seltzer Effervescent Pain Reliever and Antacid Coricidin or Coricidin-D  Indomethacin Rufen  Alka-Seltzer plus Cold Medicine Cosprin Ketoprofen S-A-C Tablets  Anacin Analgesic Tablets or Capsules Coumadin Korlgesic Salflex  Anacin Extra Strength Analgesic tablets or capsules CP-2 Tablets Lanoril Salicylate  Anaprox Cuprimine Capsules  Levenox Salocol  Anexsia-D Dalteparin Magan Salsalate  Anodynos  Darvon compound Magnesium Salicylate Sine-off  Ansaid Dasin Capsules Magsal Sodium Salicylate  Anturane Depen Capsules Marnal Soma  APF Arthritis pain formula Dewitt's Pills Measurin Stanback  Argesic Dia-Gesic Meclofenamic Sulfinpyrazone  Arthritis Bayer Timed Release Aspirin Diclofenac Meclomen Sulindac  Arthritis pain formula Anacin Dicumarol Medipren Supac  Analgesic (Safety coated) Arthralgen Diffunasal Mefanamic Suprofen  Arthritis Strength Bufferin Dihydrocodeine Mepro Compound Suprol  Arthropan liquid Dopirydamole Methcarbomol with Aspirin Synalgos  ASA tablets/Enseals Disalcid Micrainin Tagament  Ascriptin Doan's Midol Talwin  Ascriptin A/D Dolene Mobidin Tanderil  Ascriptin Extra Strength Dolobid Moblgesic Ticlid  Ascriptin with Codeine Doloprin or Doloprin with Codeine Momentum Tolectin  Asperbuf Duoprin Mono-gesic Trendar  Aspergum Duradyne Motrin or Motrin IB Triminicin  Aspirin plain, buffered or enteric coated Durasal Myochrisine Trigesic  Aspirin Suppositories Easprin Nalfon Trillsate  Aspirin with Codeine Ecotrin Regular or Extra Strength Naprosyn Uracel  Atromid-S Efficin Naproxen Ursinus  Auranofin Capsules Elmiron Neocylate Vanquish  Axotal Emagrin Norgesic Verin  Azathioprine Empirin or Empirin with Codeine Normiflo Vitamin E  Azolid Emprazil Nuprin Voltaren  Bayer Aspirin plain, buffered or children's or timed BC Tablets or powders Encaprin Orgaran Warfarin Sodium  Buff-a-Comp Enoxaparin Orudis Zorpin  Buff-a-Comp with Codeine Equegesic Os-Cal-Gesic   Buffaprin Excedrin plain, buffered or Extra Strength Oxalid   Bufferin Arthritis Strength Feldene Oxphenbutazone   Bufferin plain or Extra Strength Feldene Capsules Oxycodone with Aspirin   Bufferin with Codeine Fenoprofen Fenoprofen Pabalate or Pabalate-SF   Buffets II Flogesic Panagesic   Buffinol plain or Extra Strength Florinal or Florinal with  Codeine Panwarfarin   Buf-Tabs Flurbiprofen Penicillamine   Butalbital Compound Four-way cold tablets Penicillin   Butazolidin Fragmin Pepto-Bismol   Carbenicillin Geminisyn Percodan   Carna Arthritis Reliever Geopen Persantine   Carprofen Gold's salt Persistin   Chloramphenicol Goody's Phenylbutazone   Chloromycetin Haltrain Piroxlcam   Clmetidine heparin Plaquenil   Cllnoril Hyco-pap Ponstel   Clofibrate Hydroxy chloroquine Propoxyphen         Before stopping any of these medications, be sure to consult the physician who ordered them.  Some, such as Coumadin (Warfarin) are ordered to prevent or treat serious conditions such as "deep thrombosis", "pumonary embolisms", and other heart problems.  The amount of time that you may need off of the medication may also vary with the medication and the reason for which you were taking it.  If you are taking any of these medications, please make sure you notify your pain physician before you undergo any procedures.

## 2016-12-19 NOTE — Progress Notes (Signed)
Nursing Pain Medication Assessment:  Safety precautions to be maintained throughout the outpatient stay will include: orient to surroundings, keep bed in low position, maintain call bell within reach at all times, provide assistance with transfer out of bed and ambulation.  Medication Inspection Compliance: Pill count conducted under aseptic conditions, in front of the patient. Neither the pills nor the bottle was removed from the patient's sight at any time. Once count was completed pills were immediately returned to the patient in their original bottle.  Medication: Oxycodone IR Pill/Patch Count: 60 of 120 pills remain Bottle Appearance: Standard pharmacy container. Clearly labeled. Filled Date: 02 / 14 / 2018 Last Medication intake:  Yesterday

## 2016-12-22 LAB — 25-HYDROXY VITAMIN D LCMS D2+D3: 25-Hydroxy, Vitamin D-2: <1 ng/mL

## 2016-12-22 LAB — 25-HYDROXYVITAMIN D LCMS D2+D3
25-HYDROXY, VITAMIN D-3: 26 ng/mL
25-HYDROXY, VITAMIN D: 26 ng/mL — AB

## 2017-01-01 ENCOUNTER — Other Ambulatory Visit: Payer: Self-pay | Admitting: Pain Medicine

## 2017-01-01 DIAGNOSIS — E559 Vitamin D deficiency, unspecified: Secondary | ICD-10-CM | POA: Insufficient documentation

## 2017-01-01 MED ORDER — VITAMIN D3 50 MCG (2000 UT) PO CAPS: ORAL_CAPSULE | ORAL | 99 refills | Status: DC

## 2017-01-01 MED ORDER — VITAMIN D (ERGOCALCIFEROL) 1.25 MG (50000 UNIT) PO CAPS: ORAL_CAPSULE | ORAL | 0 refills | Status: DC

## 2017-01-01 NOTE — Progress Notes (Signed)

## 2017-01-06 ENCOUNTER — Telehealth: Payer: Self-pay | Admitting: Pain Medicine

## 2017-01-06 NOTE — Telephone Encounter (Signed)
Wants to know why she is taking massive doses of Vitamin D, please call and explain

## 2017-01-07 ENCOUNTER — Telehealth: Payer: Self-pay

## 2017-01-07 NOTE — Telephone Encounter (Signed)
Pt says someone tried to call her but does not know who. She said this may be concerning a vitamin b that she was prescribed. Please call patient

## 2017-01-07 NOTE — Telephone Encounter (Signed)
Left voicemail with patient re; vitamin dosages and why these dosages are prescribed.  Told patient if she has further questions to please call us back.

## 2017-01-07 NOTE — Telephone Encounter (Signed)
Spoke with patient re; Vitamin D Rx that was given to her from Dr Laban EmperorNaveira with [facility].

## 2017-03-06 NOTE — Progress Notes (Signed)
Patient's Name: Margaret Diaz  MRN: [id number]  Referring Provider: Maryland Pink, MD  DOB: [birth year]  PCP: Maryland Pink, MD  DOS: 03/18/2017  Note by: Vevelyn Francois NP  Service setting: Ambulatory outpatient  Specialty: Interventional Pain Management  Location: [facility] (AMB) Pain Management Facility    Patient type: Established    Primary Reason(s) for Visit: Encounter for prescription drug management (Level of risk: moderate) CC: Back Pain (lower) and Neck Pain (mid)  HPI  Margaret Diaz is a 51 y.o. year old, female patient, who comes today for a medication management evaluation. She has Dysuria; Long term current use of opiate analgesic; Long term prescription opiate use; Opiate use (30 MME/day); Opiate dependence ([place]); Encounter for therapeutic drug level monitoring; Encounter for chronic pain management; Opioid-induced constipation (OIC); Neurogenic pain; Neuropathic pain; Generalized pain; Irritable bowel syndrome; History of bilateral breast implants; GERD (gastroesophageal reflux disease); Peptic ulcer disease; Arthritis associated with inflammatory bowel disease; Plantar fasciitis (Location of Primary Source of Pain) (Bilateral) (R>L); Chronic hip pain (Location of Secondary source of pain) (Bilateral) (L>R); Chronic knee pain (Location of Tertiary source of pain) (Bilateral) (L>R); Chronic neck pain (Right); Chronic low back pain (Bilateral) (L>R); Chronic hand pain (Bilateral) (R>L); Elevated sedimentation rate; Chronic pain syndrome; Chronic feet pain (Location of Primary Source of Pain) (Bilateral) (L>R) (Burning); Vitamin D insufficiency; and Bulging of cervical intervertebral disc on her problem list. Her primarily concern today is the Back Pain (lower) and Neck Pain (mid)  Pain Assessment: Self-Reported Pain Score: 5 /10 Clinically the patient looks like a       Reported level is compatible with observation. Information on the proper use of the pain scale provided to the patient  today Pain Type: Chronic pain Pain Location: Back Pain Orientation: Lower, Right, Left Pain Descriptors / Indicators: Burning, Constant, Discomfort, Sore, Radiating Pain Frequency: Constant  Margaret Diaz was last scheduled for an appointment on 12/19/16 for medication management. During today's appointment we reviewed Margaret Diaz's chronic pain status, as well as her outpatient medication regimen. She has chronic neck and low back pain. She admtis that the neck pain is worse today. She has radicular symptoms that do down into her arms. She has occasional numbness tingling. She states that she does have some weakness in both arms however the right is greater than the left. She admits dropping a dog bowl on the floor. She admits during the physical exam that she has a "boil" on her neck. She has lanced it 3 times and her husband once. She states that it keeps coming back. She admits that it burst on it own today. She states that it has never done this. She has not been seen for this, only home treatments.   The patient  reports that she does not use drugs. Her body mass index is 29.86 kg/m.  Further details on both, my assessment(s), as well as the proposed treatment plan, please see below.  Controlled Substance Pharmacotherapy Assessment REMS (Risk Evaluation and Mitigation Strategy)  Analgesic:Oxycodone IR 5 mg every 6 hours (20 mg/day) MME/day:30 mg/day Margaret Specking, RN  03/18/2017  9:09 AM  Sign at close encounter Nursing Pain Medication Assessment:  Safety precautions to be maintained throughout the outpatient stay will include: orient to surroundings, keep bed in low position, maintain call bell within reach at all times, provide assistance with transfer out of bed and ambulation.  Medication Inspection Compliance: Pill count conducted under aseptic conditions, in front of the patient. Neither the pills  nor the bottle was removed from the patient's sight at any time. Once count was  completed pills were immediately returned to the patient in their original bottle.  Medication: See above Pill/Patch Count: 64 of 120 pills remain Pill/Patch Appearance: Markings consistent with prescribed medication Bottle Appearance: Standard pharmacy container. Clearly labeled. Filled Date: 5 / 15 / 2018 Last Medication intake:  Today   Pharmacokinetics: Liberation and absorption (onset of action): WNL Distribution (time to peak effect): WNL Metabolism and excretion (duration of action): WNL         Pharmacodynamics: Desired effects: Analgesia: Margaret Diaz reports >50% benefit. Functional ability: Patient reports that medication allows her to accomplish basic ADLs Clinically meaningful improvement in function (CMIF): Sustained CMIF goals met Perceived effectiveness: Described as relatively effective, allowing for increase in activities of daily living (ADL) Undesirable effects: Side-effects or Adverse reactions: None reported Monitoring: [place] PMP: Online review of the past 79-monthperiod conducted. Compliant with practice rules and regulations List of all UDS test(s) done:  Lab Results  Component Value Date   TOX[place] FINAL 10/01/2016   [place] 06[place]/2016   [place] 04[place]2016   [place] 01[place]   [place] 12/06/2015   TOXASSSELUR FINAL 09/11/2015   Last UDS on record: ToxAssure Select 13  Date Value Ref Range Status  10/01/2016 FINAL  Final    Comment:    ==================================================================== TOXASSURE SELECT 13 (MW) ==================================================================== Specimen Alert Note:  Urinary creatinine is low; ability to detect some drugs may be compromised.  Interpret results with caution. ==================================================================== Test                             Result       Flag       Units Drug Present and Declared for Prescription  Verification   Desmethyldiazepam              1614         EXPECTED   ng/mg creat   Oxazepam                       4800         EXPECTED   ng/mg creat   Temazepam                      3657         EXPECTED   ng/mg creat    Desmethyldiazepam, oxazepam, and temazepam are expected    metabolites of diazepam. Desmethyldiazepam and oxazepam are also    expected metabolites of other drugs, including chlordiazepoxide,    prazepam, clorazepate, and halazepam. Oxazepam is an expected    metabolite of temazepam. Oxazepam and temazepam are also    available as scheduled prescription medications.   Noroxycodone                   1607         EXPECTED   ng/mg creat    Noroxycodone is an expected metabolite of oxycodone. Sources of    oxycodone include scheduled prescription medications. Drug Absent but Declared for Prescription Verification   Oxycodone                      Not Detected UNEXPECTED ng/mg creat    Oxycodone is almost always present in patients taking this drug    consistently.  Absence of oxycodone could be due to  lapse of time    since the last dose or unusual pharmacokinetics (rapid    metabolism). ==================================================================== Test                      Result    Flag   Units      Ref Range   Creatinine              14        L      mg/dL      >=20 ==================================================================== Declared Medications:  The flagging and interpretation on this report are based on the  following declared medications.  Unexpected results may arise from  inaccuracies in the declared medications.  **Note: The testing scope of this panel includes these medications:  Diazepam  Oxycodone  **Note: The testing scope of this panel does not include following  reported medications:  Bupropion  Diphenhydramine (Benadryl)  Gabapentin  Gabapentin (Neurontin)  Omeprazole  Quetiapine (Seroquel)  Sucralfate (Carafate)  Triamcinolone  (Kenalog) ==================================================================== For clinical consultation, please call [phone]. ====================================================================    UDS interpretation: Compliant          Medication Assessment Form: Reviewed. Patient indicates being compliant with therapy Treatment compliance: Compliant Risk Assessment Profile: Aberrant behavior: See prior evaluations. None observed or detected today Comorbid factors increasing risk of overdose: See prior notes. No additional risks detected today Risk of substance use disorder (SUD): Low Opioid Risk Tool (ORT) Total Score: 0  Interpretation Table:  Score <3 = Low Risk for SUD  Score between 4-7 = Moderate Risk for SUD  Score >8 = High Risk for Opioid Abuse   Risk Mitigation Strategies:  Patient Counseling: Covered Patient-Prescriber Agreement (PPA): Present and active  Notification to other healthcare providers: Done  Pharmacologic Plan: No change in therapy, at this time  Laboratory Chemistry  Inflammation Markers Lab Results  Component Value Date   CRP <0.8 12/19/2016   ESRSEDRATE 10 12/19/2016   (CRP: Acute Phase) (ESR: Chronic Phase) Renal Function Markers Lab Results  Component Value Date   BUN 15 12/19/2016   CREATININE 0.85 12/19/2016   GFRAA >60 12/19/2016   GFRNONAA >60 12/19/2016   Hepatic Function Markers Lab Results  Component Value Date   AST 21 12/19/2016   ALT 17 12/19/2016   ALBUMIN 4.4 12/19/2016   ALKPHOS 107 12/19/2016   Electrolytes Lab Results  Component Value Date   NA 139 12/19/2016   K 4.6 12/19/2016   CL 103 12/19/2016   CALCIUM 9.6 12/19/2016   MG 2.1 12/19/2016   Neuropathy Markers Lab Results  Component Value Date   VITAMINB12 315 12/19/2016   Bone Pathology Markers Lab Results  Component Value Date   ALKPHOS 107 12/19/2016   25OHVITD1 26 (L) 12/19/2016   25OHVITD2 <1.0 12/19/2016   25OHVITD3 26 12/19/2016    CALCIUM 9.6 12/19/2016   Coagulation Parameters Lab Results  Component Value Date   PLT 249 06/30/2015   Cardiovascular Markers Lab Results  Component Value Date   BNP 86 09/13/2014   HGB 12.8 06/30/2015   HCT 39.2 06/30/2015   Note: Lab results reviewed.  Recent Diagnostic Imaging Review  No results found. Note: Imaging results reviewed.          Meds  The patient has a current medication list which includes the following prescription(s): bupropion, vitamin d3, diazepam, diphenhydramine, duloxetine, gabapentin, gabapentin, hydroxyzine, omeprazole, quetiapine, sucralfate, vitamin d (ergocalciferol), oxycodone, oxycodone, and oxycodone.  Current Outpatient Prescriptions on File Prior  to Visit  Medication Sig  . buPROPion (WELLBUTRIN SR) 150 MG 12 hr tablet Take 150 mg by mouth 2 (two) times daily.  . Cholecalciferol (VITAMIN D3) 2000 units capsule Take 1 capsule (2,000 Units total) by mouth daily.  . diazepam (VALIUM) 10 MG tablet TAKE 1 TABLET BY MOUTH TWICE DAILY  . diphenhydrAMINE (BENADRYL) 25 MG tablet Take 25 mg by mouth every 6 (six) hours as needed.  Marland Kitchen omeprazole (PRILOSEC) 40 MG capsule Take 40 mg by mouth 2 (two) times daily.  . QUEtiapine (SEROQUEL) 100 MG tablet Take 100 mg by mouth 2 (two) times daily.  . sucralfate (CARAFATE) 1 G tablet Take 1 tablet by mouth 2 (two) times daily.   . Vitamin D, Ergocalciferol, (DRISDOL) 50000 units CAPS capsule Take 1 capsule (50,000 Units total) by mouth 2 (two) times a week x 6 weeks.   No current facility-administered medications on file prior to visit.    ROS  Constitutional: Denies any fever or chills Gastrointestinal: No reported hemesis, hematochezia, vomiting, or acute GI distress Musculoskeletal: Denies any acute onset joint swelling, redness, loss of ROM, or weakness Neurological: No reported episodes of acute onset apraxia, aphasia, dysarthria, agnosia, amnesia, paralysis, loss of coordination, or loss of  consciousness  Allergies  Ms. Faulks is allergic to erythromycin ethylsuccinate; erythromycin; hydromorphone; hydromorphone hcl; and meperidine.  PFSH  Drug: Ms. [place]  reports that she does not use drugs. Alcohol:  reports that she does not drink alcohol. Tobacco:  reports that she quit smoking about 5 years ago. Her smoking use included Cigarettes. She has a 3.00 pack-year smoking history. She has never used smokeless tobacco. Medical:  has a past medical history of Arthritis; Dyspnea (05/10/2015); GERD (gastroesophageal reflux disease); bilateral breast implants; Irritable bowel syndrome (IBS); and Pleural effusion on right (04/10/2015). Family: family history includes Mental illness in her mother.  Past Surgical History:  Procedure Laterality Date  . ABDOMINAL HYSTERECTOMY    . BREAST ENHANCEMENT SURGERY  1999   Constitutional Exam  General appearance: Well nourished, well developed, and well hydrated. In no apparent acute distress Vitals:   03/18/17 0856  BP: 129/73  Resp: 16  Temp: 98.1 F (36.7 C)  SpO2: 96%  Weight: 185 lb (83.9 kg)  Height: '5\' 6"'$  (1.676 m)   BMI Assessment: Estimated body mass index is 29.86 kg/m as calculated from the following:   Height as of this encounter: '5\' 6"'$  (1.676 m).   Weight as of this encounter: 185 lb (83.9 kg).  BMI interpretation table: BMI level Category Range association with higher incidence of chronic pain  <18 kg/m2 Underweight   18.5-24.9 kg/m2 Ideal body weight   25-29.9 kg/m2 Overweight Increased incidence by 20%  30-34.9 kg/m2 Obese (Class I) Increased incidence by 68%  35-39.9 kg/m2 Severe obesity (Class II) Increased incidence by 136%  >40 kg/m2 Extreme obesity (Class III) Increased incidence by 254%   BMI Readings from Last 4 Encounters:  03/18/17 29.86 kg/m  12/19/16 28.19 kg/m  10/01/16 29.05 kg/m  06/26/16 30.02 kg/m   Wt Readings from Last 4 Encounters:  03/18/17 185 lb (83.9 kg)  12/19/16 180 lb (81.6 kg)   10/01/16 180 lb (81.6 kg)  06/26/16 186 lb (84.4 kg)  Psych/Mental status: Alert, oriented x 3 (person, place, & time)       Eyes: PERLA Respiratory: No evidence of acute respiratory distress  Cervical Spine Exam  Inspection: Band-Aid patent covering cyst, site erythematous and draining Alignment: Symmetrical Functional ROM: Unrestricted ROM  Stability: No instability detected Muscle strength & Tone: Functionally intact Sensory: Unimpaired Palpation: deferred secondary to open area.              Upper Extremity (UE) Exam    Side: Right upper extremity  Side: Left upper extremity  Inspection: No masses, redness, swelling, or asymmetry. No contractures  Inspection: No masses, redness, swelling, or asymmetry. No contractures  Functional ROM: Unrestricted ROM          Functional ROM: Unrestricted ROM          Muscle strength & Tone: Functionally intact  Muscle strength & Tone: Functionally intact  Sensory: Unimpaired  Sensory: Unimpaired  Palpation: No palpable anomalies              Palpation: No palpable anomalies              Specialized Test(s): Deferred         Specialized Test(s): Deferred           Gait & Posture Assessment  Ambulation: Unassisted Gait: Relatively normal for age and body habitus Posture: WNL   Lower Extremity Exam    Side: Right lower extremity  Side: Left lower extremity  Inspection: No masses, redness, swelling, or asymmetry. No contractures  Inspection: No masses, redness, swelling, or asymmetry. No contractures  Functional ROM: Unrestricted ROM          Functional ROM: Unrestricted ROM          Muscle strength & Tone: Functionally intact  Muscle strength & Tone: Functionally intact  Sensory: Unimpaired  Sensory: Unimpaired  Palpation: No palpable anomalies  Palpation: No palpable anomalies   Assessment  Primary Diagnosis & Pertinent Problem List: The primary encounter diagnosis was Bulging of cervical intervertebral disc. Diagnoses of Chronic neck  pain (Right), Neurogenic pain, Chronic pain syndrome, and Long term current use of opiate analgesic were also pertinent to this visit.  Status Diagnosis  Controlled Controlled Controlled 1. Bulging of cervical intervertebral disc   2. Chronic neck pain (Right)   3. Neurogenic pain   4. Chronic pain syndrome   5. Long term current use of opiate analgesic     Problems updated and reviewed during this visit: Problem  Bulging of Cervical Intervertebral Disc   Plan of Care  Pharmacotherapy (Medications Ordered): Meds ordered this encounter  Medications  . oxyCODONE (OXY IR/ROXICODONE) 5 MG immediate release tablet    Sig: Take 1 tablet (5 mg total) by mouth every 6 (six) hours as needed for severe pain.    Dispense:  120 tablet    Refill:  0    Do not place this medication, or any other prescription from our practice, on "Automatic Refill". Patient may have prescription filled one day early if pharmacy is closed on scheduled refill date. Do not fill until: 06/02/17 To last until: 07/02/17    Order Specific Question:   Supervising Provider    Answer:   Milinda Pointer [phone]  . oxyCODONE (OXY IR/ROXICODONE) 5 MG immediate release tablet    Sig: Take 1 tablet (5 mg total) by mouth every 6 (six) hours as needed for severe pain.    Dispense:  120 tablet    Refill:  0    Do not place this medication, or any other prescription from our practice, on "Automatic Refill". Patient may have prescription filled one day early if pharmacy is closed on scheduled refill date. Do not fill until: 04/03/17 To last until: 05/03/17    Order Specific  Question:   Supervising Provider    Answer:   Milinda Pointer [phone]  . oxyCODONE (OXY IR/ROXICODONE) 5 MG immediate release tablet    Sig: Take 1 tablet (5 mg total) by mouth every 6 (six) hours as needed for severe pain.    Dispense:  120 tablet    Refill:  0    Do not place this medication, or any other prescription from our practice, on  "Automatic Refill". Patient may have prescription filled one day early if pharmacy is closed on scheduled refill date. Do not fill until: 05/03/17 To last until: 06/02/17    Order Specific Question:   Supervising Provider    Answer:   Milinda Pointer [phone]  . gabapentin (NEURONTIN) 100 MG capsule    Sig: Take 1 capsule (100 mg total) by mouth 4 (four) times daily.    Dispense:  360 capsule    Refill:  0    Do not place this medication, or any other prescription from our practice, on "Automatic Refill". Patient may have prescription filled one day early if pharmacy is closed on scheduled refill date.    Order Specific Question:   Supervising Provider    Answer:   Milinda Pointer [phone]  . gabapentin (NEURONTIN) 800 MG tablet    Sig: Take 1 tablet (800 mg total) by mouth 4 (four) times daily.    Dispense:  360 tablet    Refill:  0    Do not add this medication to the electronic "Automatic Refill" notification system. Patient may have prescription filled one day early if pharmacy is closed on scheduled refill date.    Order Specific Question:   Supervising Provider    Answer:   Milinda Pointer [[id number]]   New Prescriptions   No medications on file   Medications administered today: Ms. Nuzzo had no medications administered during this visit. Lab-work, procedure(s), and/or referral(s): Orders Placed This Encounter  Procedures  . ToxASSURE Select 13 (MW), Urine   Imaging and/or referral(s): None  Interventional therapies: Planned, scheduled, and/or pending:   Continue with current regimen Encourage pt to follow up with PCP for evaluation of neck cyst   Considering:   Diagnostic left LSB Diagnostic bilateral intra-articular hipjoint injection  Possible bilateral hip joint radiofrequency ablation Diagnostic bilateral intra-articular knee joint injection Diagnostic bilateral genicular nerve block Possible diagnostic bilateral genicular nerve radiofrequency  ablation  Diagnostic bilateral lumbar facet block Possible bilateral lumbar facet radiofrequencyablation   Diagnostic right-sided cervical epidural steroid injection Diagnostic right-sided cervical facet block Possible right-sided cervical facet radiofrequencyablation    Palliative PRN treatment(s):   Palliative left LSB Palliative bilateral intra-articular hipjoint injection  Palliative bilateral intra-articular knee joint injection Palliative bilateral genicular nerve block Palliative bilateral lumbar facet block Palliative right-sided cervical epidural steroid injection Palliative right-sided cervical facet block   Provider-requested follow-up: Return in about 3 months (around 06/18/2017) for Medication Mgmt.  No future appointments. Primary Care Physician: Maryland Pink, MD Location: [facility] Note by: Vevelyn Francois NP Date: 03/18/2017; Time: 9:37 AM  Pain Score Disclaimer: We use the NRS-11 scale. This is a self-reported, subjective measurement of pain severity with only modest accuracy. It is used primarily to identify changes within a particular patient. It must be understood that outpatient pain scales are significantly less accurate that those used for research, where they can be applied under ideal controlled circumstances with minimal exposure to variables. In reality, the score is likely to be a combination of pain intensity and pain  affect, where pain affect describes the degree of emotional arousal or changes in action readiness caused by the sensory experience of pain. Factors such as social and work situation, setting, emotional state, anxiety levels, expectation, and prior pain experience may influence pain perception and show large inter-individual differences that may also be affected by time variables.  Patient instructions provided during this appointment: Patient Instructions    ____________________________________________________________________________________________  Medication Rules  Applies to: All patients receiving prescriptions (written or electronic).  Pharmacy of record: Pharmacy where electronic prescriptions will be sent. If written prescriptions are taken to a different pharmacy, please inform the nursing staff. The pharmacy listed in the electronic medical record should be the one where you would like electronic prescriptions to be sent.  Prescription refills: Only during scheduled appointments. Applies to both, written and electronic prescriptions.  NOTE: The following applies primarily to controlled substances (Opioid Pain Medications)  Patient's responsibilities: 1. Pain Pills: Bring all pain pills to every appointment (except for procedure appointments). 2. Pill Bottles: Bring pills in original pharmacy bottle. Always bring newest bottle. Bring bottle, even if empty. 3. Medication refills: You are responsible for knowing and keeping track of what medications you need refilled. The day before your appointment, write a list of all prescriptions that need to be refilled. Bring that list to your appointment and give it to the admitting nurse. Prescriptions will be written only during appointments. If you forget a medication, it will not be "Called in", "Faxed", or "electronically sent". You will need to get another appointment to get these prescribed. 4. Prescription Accuracy: You are responsible for carefully inspecting your prescriptions before leaving our office. Have the discharge nurse carefully go over each prescription with you, before taking them home. Make sure that your name is accurately spelled, that your address is correct. Check the name and dose of your medication to make sure it is accurate. Check the number of pills, and the written instructions to make sure they are clear and accurate. Make sure that you are given enough medication to  last until your next medication refill appointment. 5. Taking Medication: Take medication as prescribed. Never take more pills than instructed. Never take medication more frequently than prescribed. Taking less pills or less frequently is permitted and encouraged, when it comes to controlled substances (written prescriptions).  6. Inform other Doctors: Always inform, all of your healthcare providers, of all the medications you take. 7. Pain Medication from other Providers: You are not allowed to accept any additional pain medication from any other Doctor or Healthcare provider. There are two exceptions to this rule. (see below) In the event that you require additional pain medication, you are responsible for notifying us, as stated below. 8. Medication Agreement: You are responsible for carefully reading and following our Medication Agreement. This must be signed before receiving any prescriptions from our practice. Safely store a copy of your signed Agreement. Violations to the Agreement will result in no further prescriptions. (Additional copies of our Medication Agreement are available upon request.) 9. Laws, Rules, & Regulations: All patients are expected to follow all Federal and [employer], Rules, [employer]. Ignorance of the Laws does not constitute a valid excuse.  Exceptions: There are only two exceptions to the rule of not receiving pain medications from other Healthcare Providers. 1. Exception #1 (Emergencies): In the event of an emergency (i.e.: accident requiring emergency care), you are allowed to receive additional pain medication. However, you are responsible for: As soon as you are  able, call our office [phone], at any time of the day or night, and leave a message stating your name, the date and nature of the emergency, and the name and dose of the medication prescribed. In the event that your call is answered by a member of our staff, make sure to document and save the  date, time, and the name of the person that took your information.  2. Exception #2 (Planned Surgery): In the event that you are scheduled by another doctor or dentist to have any type of surgery or procedure, you are allowed (for a period no longer than 30 days), to receive additional pain medication, for the acute post-op pain. However, in this case, you are responsible for picking up a copy of our "Post-op Pain Management for Surgeons" handout, and giving it to your surgeon or dentist. This document is available at our office, and does not require an appointment to obtain it. Simply go to our office during business hours (Monday-Thursday from 8:00 AM to 4:00 PM) (Friday 8:00 AM to 12:00 Noon) or if you have a scheduled appointment with [place], prior to your surgery, and ask for it by name. In addition, you will need to provide [place] with your name, name of your surgeon, type of surgery, and date of procedure or surgery.  _____________________________________________________________________________________________  ____________________________________________________________________________________________  Pain Scale  Introduction: The pain score used by this practice is the Verbal Numerical Rating Scale (VNRS-11). This is an 11-point scale. It is for adults and children 10 years or older. There are significant differences in how the pain score is reported, used, and applied. Forget everything you learned in the past and learn this scoring system.  General Information: The scale should reflect your current level of pain. Unless you are specifically asked for the level of your worst pain, or your average pain. If you are asked for one of these two, then it should be understood that it is over the past 24 hours.  Basic Activities of Daily Living (ADL): Personal hygiene, dressing, eating, transferring, and using restroom.  Instructions: Most patients tend to report their level of pain as a combination of two  factors, their physical pain and their psychosocial pain. This last one is also known as "suffering" and it is reflection of how physical pain affects you socially and psychologically. From now on, report them separately. From this point on, when asked to report your pain level, report only your physical pain. Use the following table for reference.  Pain Clinic Pain Levels (0-5/10)  Pain Level Score  Description  No Pain 0   Mild pain 1 Nagging, annoying, but does not interfere with basic activities of daily living (ADL). Patients are able to eat, bathe, get dressed, toileting (being able to get on and off the toilet and perform personal hygiene functions), transfer (move in and out of bed or a chair without assistance), and maintain continence (able to control bladder and bowel functions). Blood pressure and heart rate are unaffected. A normal heart rate for a healthy adult ranges from 60 to 100 bpm (beats per minute).   Mild to moderate pain 2 Noticeable and distracting. Impossible to hide from other people. More frequent flare-ups. Still possible to adapt and function close to normal. It can be very annoying and may have occasional stronger flare-ups. With discipline, patients may get used to it and adapt.   Moderate pain 3 Interferes significantly with activities of daily living (ADL). It becomes difficult to feed, bathe,  get dressed, get on and off the toilet or to perform personal hygiene functions. Difficult to get in and out of bed or a chair without assistance. Very distracting. With effort, it can be ignored when deeply involved in activities.   Moderately severe pain 4 Impossible to ignore for more than a few minutes. With effort, patients may still be able to manage work or participate in some social activities. Very difficult to concentrate. Signs of autonomic nervous system discharge are evident: dilated pupils (mydriasis); mild sweating (diaphoresis); sleep interference. Heart rate becomes  elevated (>115 bpm). Diastolic blood pressure (lower number) rises above 100 mmHg. Patients find relief in laying down and not moving.   Severe pain 5 Intense and extremely unpleasant. Associated with frowning face and frequent crying. Pain overwhelms the senses.  Ability to do any activity or maintain social relationships becomes significantly limited. Conversation becomes difficult. Pacing back and forth is common, as getting into a comfortable position is nearly impossible. Pain wakes you up from deep sleep. Physical signs will be obvious: pupillary dilation; increased sweating; goosebumps; brisk reflexes; cold, clammy hands and feet; nausea, vomiting or dry heaves; loss of appetite; significant sleep disturbance with inability to fall asleep or to remain asleep. When persistent, significant weight loss is observed due to the complete loss of appetite and sleep deprivation.  Blood pressure and heart rate becomes significantly elevated. Caution: If elevated blood pressure triggers a pounding headache associated with blurred vision, then the patient should immediately seek attention at an urgent or emergency care unit, as these may be signs of an impending stroke.    Emergency Department Pain Levels (6-10/10)  Emergency Room Pain 6 Severely limiting. Requires emergency care and should not be seen or managed at an outpatient pain management facility. Communication becomes difficult and requires great effort. Assistance to reach the emergency department may be required. Facial flushing and profuse sweating along with potentially dangerous increases in heart rate and blood pressure will be evident.   Distressing pain 7 Self-care is very difficult. Assistance is required to transport, or use restroom. Assistance to reach the emergency department will be required. Tasks requiring coordination, such as bathing and getting dressed become very difficult.   Disabling pain 8 Self-care is no longer possible. At  this level, pain is disabling. The individual is unable to do even the most "basic" activities such as walking, eating, bathing, dressing, transferring to a bed, or toileting. Fine motor skills are lost. It is difficult to think clearly.   Incapacitating pain 9 Pain becomes incapacitating. Thought processing is no longer possible. Difficult to remember your own name. Control of movement and coordination are lost.   The worst pain imaginable 10 At this level, most patients pass out from pain. When this level is reached, collapse of the autonomic nervous system occurs, leading to a sudden drop in blood pressure and heart rate. This in turn results in a temporary and dramatic drop in blood flow to the brain, leading to a loss of consciousness. Fainting is one of the body's self defense mechanisms. Passing out puts the brain in a calmed state and causes it to shut down for a while, in order to begin the healing process.    Summary: 1. Refer to this scale when providing [place] with your pain level. 2. Be accurate and careful when reporting your pain level. This will help with your care. 3. Over-reporting your pain level will lead to loss of credibility. 4. Even a level of 1/10 means  that there is pain and will be treated at our facility. 5. High, inaccurate reporting will be documented as "Symptom Exaggeration", leading to loss of credibility and suspicions of possible secondary gains such as obtaining more narcotics, or wanting to appear disabled, for fraudulent reasons. 6. Only pain levels of 5 or below will be seen at our facility. 7. Pain levels of 6 and above will be sent to the Emergency Department and the appointment cancelled. _____________________________________________________________________________________________

## 2017-03-18 ENCOUNTER — Ambulatory Visit: Payer: Self-pay | Attending: Nurse Practitioner | Admitting: Nurse Practitioner

## 2017-03-18 ENCOUNTER — Encounter: Payer: Self-pay | Admitting: Nurse Practitioner

## 2017-03-18 VITALS — BP 129/73 | Temp 98.1°F | Resp 16 | Ht 66.0 in | Wt 185.0 lb

## 2017-03-18 DIAGNOSIS — M5020 Other cervical disc displacement, unspecified cervical region: Secondary | ICD-10-CM | POA: Insufficient documentation

## 2017-03-18 DIAGNOSIS — M5030 Other cervical disc degeneration, unspecified cervical region: Secondary | ICD-10-CM

## 2017-03-18 DIAGNOSIS — M722 Plantar fascial fibromatosis: Secondary | ICD-10-CM | POA: Insufficient documentation

## 2017-03-18 DIAGNOSIS — G894 Chronic pain syndrome: Secondary | ICD-10-CM | POA: Insufficient documentation

## 2017-03-18 DIAGNOSIS — Z90710 Acquired absence of both cervix and uterus: Secondary | ICD-10-CM | POA: Insufficient documentation

## 2017-03-18 DIAGNOSIS — M79641 Pain in right hand: Secondary | ICD-10-CM | POA: Insufficient documentation

## 2017-03-18 DIAGNOSIS — Z79891 Long term (current) use of opiate analgesic: Secondary | ICD-10-CM | POA: Insufficient documentation

## 2017-03-18 DIAGNOSIS — Z818 Family history of other mental and behavioral disorders: Secondary | ICD-10-CM | POA: Insufficient documentation

## 2017-03-18 DIAGNOSIS — M1990 Unspecified osteoarthritis, unspecified site: Secondary | ICD-10-CM | POA: Insufficient documentation

## 2017-03-18 DIAGNOSIS — Z881 Allergy status to other antibiotic agents status: Secondary | ICD-10-CM | POA: Insufficient documentation

## 2017-03-18 DIAGNOSIS — M792 Neuralgia and neuritis, unspecified: Secondary | ICD-10-CM | POA: Insufficient documentation

## 2017-03-18 DIAGNOSIS — Z9882 Breast implant status: Secondary | ICD-10-CM | POA: Insufficient documentation

## 2017-03-18 DIAGNOSIS — E559 Vitamin D deficiency, unspecified: Secondary | ICD-10-CM | POA: Insufficient documentation

## 2017-03-18 DIAGNOSIS — Z87891 Personal history of nicotine dependence: Secondary | ICD-10-CM | POA: Insufficient documentation

## 2017-03-18 DIAGNOSIS — M542 Cervicalgia: Secondary | ICD-10-CM

## 2017-03-18 DIAGNOSIS — Z888 Allergy status to other drugs, medicaments and biological substances status: Secondary | ICD-10-CM | POA: Insufficient documentation

## 2017-03-18 DIAGNOSIS — K589 Irritable bowel syndrome without diarrhea: Secondary | ICD-10-CM | POA: Insufficient documentation

## 2017-03-18 DIAGNOSIS — G8929 Other chronic pain: Secondary | ICD-10-CM

## 2017-03-18 DIAGNOSIS — K219 Gastro-esophageal reflux disease without esophagitis: Secondary | ICD-10-CM | POA: Insufficient documentation

## 2017-03-18 DIAGNOSIS — Z885 Allergy status to narcotic agent status: Secondary | ICD-10-CM | POA: Insufficient documentation

## 2017-03-18 DIAGNOSIS — J90 Pleural effusion, not elsewhere classified: Secondary | ICD-10-CM | POA: Insufficient documentation

## 2017-03-18 MED ORDER — OXYCODONE HCL 5 MG PO TABS: 5.0000 mg | ORAL_TABLET | Freq: Four times a day (QID) | ORAL | 0 refills | Status: DC | PRN

## 2017-03-18 MED ORDER — GABAPENTIN 100 MG PO CAPS: 100.0000 mg | ORAL_CAPSULE | Freq: Four times a day (QID) | ORAL | 0 refills | Status: DC

## 2017-03-18 MED ORDER — GABAPENTIN 800 MG PO TABS: 800.0000 mg | ORAL_TABLET | Freq: Four times a day (QID) | ORAL | 0 refills | Status: DC

## 2017-03-18 NOTE — Progress Notes (Signed)
Nursing Pain Medication Assessment:  Safety precautions to be maintained throughout the outpatient stay will include: orient to surroundings, keep bed in low position, maintain call bell within reach at all times, provide assistance with transfer out of bed and ambulation.  Medication Inspection Compliance: Pill count conducted under aseptic conditions, in front of the patient. Neither the pills nor the bottle was removed from the patient's sight at any time. Once count was completed pills were immediately returned to the patient in their original bottle.  Medication: See above Pill/Patch Count: 64 of 120 pills remain Pill/Patch Appearance: Markings consistent with prescribed medication Bottle Appearance: Standard pharmacy container. Clearly labeled. Filled Date: 5 / 15 / 2018 Last Medication intake:  Today

## 2017-03-18 NOTE — Patient Instructions (Addendum)
____________________________________________________________________________________________  Medication Rules  Applies to: All patients receiving prescriptions (written or electronic).  Pharmacy of record: Pharmacy where electronic prescriptions will be sent. If written prescriptions are taken to a different pharmacy, please inform the nursing staff. The pharmacy listed in the electronic medical record should be the one where you would like electronic prescriptions to be sent.  Prescription refills: Only during scheduled appointments. Applies to both, written and electronic prescriptions.  NOTE: The following applies primarily to controlled substances (Opioid Pain Medications)  Patient's responsibilities: 1. Pain Pills: Bring all pain pills to every appointment (except for procedure appointments). 2. Pill Bottles: Bring pills in original pharmacy bottle. Always bring newest bottle. Bring bottle, even if empty. 3. Medication refills: You are responsible for knowing and keeping track of what medications you need refilled. The day before your appointment, write a list of all prescriptions that need to be refilled. Bring that list to your appointment and give it to the admitting nurse. Prescriptions will be written only during appointments. If you forget a medication, it will not be "Called in", "Faxed", or "electronically sent". You will need to get another appointment to get these prescribed. 4. Prescription Accuracy: You are responsible for carefully inspecting your prescriptions before leaving our office. Have the discharge nurse carefully go over each prescription with you, before taking them home. Make sure that your name is accurately spelled, that your address is correct. Check the name and dose of your medication to make sure it is accurate. Check the number of pills, and the written instructions to make sure they are clear and accurate. Make sure that you are given enough medication to last  until your next medication refill appointment. 5. Taking Medication: Take medication as prescribed. Never take more pills than instructed. Never take medication more frequently than prescribed. Taking less pills or less frequently is permitted and encouraged, when it comes to controlled substances (written prescriptions).  6. Inform other Doctors: Always inform, all of your healthcare providers, of all the medications you take. 7. Pain Medication from other Providers: You are not allowed to accept any additional pain medication from any other Doctor or Healthcare provider. There are two exceptions to this rule. (see below) In the event that you require additional pain medication, you are responsible for notifying us, as stated below. 8. Medication Agreement: You are responsible for carefully reading and following our Medication Agreement. This must be signed before receiving any prescriptions from our practice. Safely store a copy of your signed Agreement. Violations to the Agreement will result in no further prescriptions. (Additional copies of our Medication Agreement are available upon request.) 9. Laws, Rules, & Regulations: All patients are expected to follow all Federal and State Laws, Statutes, Rules, & Regulations. Ignorance of the Laws does not constitute a valid excuse.  Exceptions: There are only two exceptions to the rule of not receiving pain medications from other Healthcare Providers. 1. Exception #1 (Emergencies): In the event of an emergency (i.e.: accident requiring emergency care), you are allowed to receive additional pain medication. However, you are responsible for: As soon as you are able, call our office [phone], at any time of the day or night, and leave a message stating your name, the date and nature of the emergency, and the name and dose of the medication prescribed. In the event that your call is answered by a member of our staff, make sure to document and save the date,  time, and the name of the person that   took your information.  2. Exception #2 (Planned Surgery): In the event that you are scheduled by another doctor or dentist to have any type of surgery or procedure, you are allowed (for a period no longer than 30 days), to receive additional pain medication, for the acute post-op pain. However, in this case, you are responsible for picking up a copy of our "Post-op Pain Management for Surgeons" handout, and giving it to your surgeon or dentist. This document is available at our office, and does not require an appointment to obtain it. Simply go to our office during business hours (Monday-Thursday from 8:00 AM to 4:00 PM) (Friday 8:00 AM to 12:00 Noon) or if you have a scheduled appointment with us, prior to your surgery, and ask for it by name. In addition, you will need to provide us with your name, name of your surgeon, type of surgery, and date of procedure or surgery.  _____________________________________________________________________________________________  ____________________________________________________________________________________________  Pain Scale  Introduction: The pain score used by this practice is the Verbal Numerical Rating Scale (VNRS-11). This is an 11-point scale. It is for adults and children 10 years or older. There are significant differences in how the pain score is reported, used, and applied. Forget everything you learned in the past and learn this scoring system.  General Information: The scale should reflect your current level of pain. Unless you are specifically asked for the level of your worst pain, or your average pain. If you are asked for one of these two, then it should be understood that it is over the past 24 hours.  Basic Activities of Daily Living (ADL): Personal hygiene, dressing, eating, transferring, and using restroom.  Instructions: Most patients tend to report their level of pain as a combination of two  factors, their physical pain and their psychosocial pain. This last one is also known as "suffering" and it is reflection of how physical pain affects you socially and psychologically. From now on, report them separately. From this point on, when asked to report your pain level, report only your physical pain. Use the following table for reference.  Pain Clinic Pain Levels (0-5/10)  Pain Level Score  Description  No Pain 0   Mild pain 1 Nagging, annoying, but does not interfere with basic activities of daily living (ADL). Patients are able to eat, bathe, get dressed, toileting (being able to get on and off the toilet and perform personal hygiene functions), transfer (move in and out of bed or a chair without assistance), and maintain continence (able to control bladder and bowel functions). Blood pressure and heart rate are unaffected. A normal heart rate for a healthy adult ranges from 60 to 100 bpm (beats per minute).   Mild to moderate pain 2 Noticeable and distracting. Impossible to hide from other people. More frequent flare-ups. Still possible to adapt and function close to normal. It can be very annoying and may have occasional stronger flare-ups. With discipline, patients may get used to it and adapt.   Moderate pain 3 Interferes significantly with activities of daily living (ADL). It becomes difficult to feed, bathe, get dressed, get on and off the toilet or to perform personal hygiene functions. Difficult to get in and out of bed or a chair without assistance. Very distracting. With effort, it can be ignored when deeply involved in activities.   Moderately severe pain 4 Impossible to ignore for more than a few minutes. With effort, patients may still be able to manage work or participate in   some social activities. Very difficult to concentrate. Signs of autonomic nervous system discharge are evident: dilated pupils (mydriasis); mild sweating (diaphoresis); sleep interference. Heart rate becomes  elevated (>115 bpm). Diastolic blood pressure (lower number) rises above 100 mmHg. Patients find relief in laying down and not moving.   Severe pain 5 Intense and extremely unpleasant. Associated with frowning face and frequent crying. Pain overwhelms the senses.  Ability to do any activity or maintain social relationships becomes significantly limited. Conversation becomes difficult. Pacing back and forth is common, as getting into a comfortable position is nearly impossible. Pain wakes you up from deep sleep. Physical signs will be obvious: pupillary dilation; increased sweating; goosebumps; brisk reflexes; cold, clammy hands and feet; nausea, vomiting or dry heaves; loss of appetite; significant sleep disturbance with inability to fall asleep or to remain asleep. When persistent, significant weight loss is observed due to the complete loss of appetite and sleep deprivation.  Blood pressure and heart rate becomes significantly elevated. Caution: If elevated blood pressure triggers a pounding headache associated with blurred vision, then the patient should immediately seek attention at an urgent or emergency care unit, as these may be signs of an impending stroke.    Emergency Department Pain Levels (6-10/10)  Emergency Room Pain 6 Severely limiting. Requires emergency care and should not be seen or managed at an outpatient pain management facility. Communication becomes difficult and requires great effort. Assistance to reach the emergency department may be required. Facial flushing and profuse sweating along with potentially dangerous increases in heart rate and blood pressure will be evident.   Distressing pain 7 Self-care is very difficult. Assistance is required to transport, or use restroom. Assistance to reach the emergency department will be required. Tasks requiring coordination, such as bathing and getting dressed become very difficult.   Disabling pain 8 Self-care is no longer possible. At  this level, pain is disabling. The individual is unable to do even the most "basic" activities such as walking, eating, bathing, dressing, transferring to a bed, or toileting. Fine motor skills are lost. It is difficult to think clearly.   Incapacitating pain 9 Pain becomes incapacitating. Thought processing is no longer possible. Difficult to remember your own name. Control of movement and coordination are lost.   The worst pain imaginable 10 At this level, most patients pass out from pain. When this level is reached, collapse of the autonomic nervous system occurs, leading to a sudden drop in blood pressure and heart rate. This in turn results in a temporary and dramatic drop in blood flow to the brain, leading to a loss of consciousness. Fainting is one of the body's self defense mechanisms. Passing out puts the brain in a calmed state and causes it to shut down for a while, in order to begin the healing process.    Summary: 1. Refer to this scale when providing us with your pain level. 2. Be accurate and careful when reporting your pain level. This will help with your care. 3. Over-reporting your pain level will lead to loss of credibility. 4. Even a level of 1/10 means that there is pain and will be treated at our facility. 5. High, inaccurate reporting will be documented as "Symptom Exaggeration", leading to loss of credibility and suspicions of possible secondary gains such as obtaining more narcotics, or wanting to appear disabled, for fraudulent reasons. 6. Only pain levels of 5 or below will be seen at our facility. 7. Pain levels of 6 and above will be   sent to the Emergency Department and the appointment cancelled. _____________________________________________________________________________________________   

## 2017-04-28 ENCOUNTER — Ambulatory Visit: Admission: RE | Admit: 2017-04-28 | Discharge: 2017-04-28 | Attending: Dermatology | Admitting: Dermatology

## 2017-04-28 DIAGNOSIS — L309 Dermatitis, unspecified: Principal | ICD-10-CM

## 2017-04-28 MED ORDER — BETAMETHASONE DIPROPIONATE 0.05 % TOPICAL OINTMENT
3 refills | 0 days | Status: CP
Start: 2017-04-28 — End: ?

## 2017-05-19 ENCOUNTER — Ambulatory Visit: Admission: RE | Admit: 2017-05-19 | Discharge: 2017-05-19

## 2017-05-19 DIAGNOSIS — L403 Pustulosis palmaris et plantaris: Principal | ICD-10-CM

## 2017-05-26 IMAGING — CT CT ABD-PELV W/ CM
2 of 5 series · 15 of 46 positions shown, 17 images · IV contrast (omnipaque)
Comparison: Ultrasound 10/12/2014. Report from abdominal CT
04/14/2008. Chest radiographs 09/13/2014.

CLINICAL DATA: Extreme vomiting and diarrhea for 8 months,
worsening over the last week. Back pain with productive cough for
over the last 3 months. No history of malignancy. Initial encounter.

EXAM:
CT ABDOMEN AND PELVIS WITH CONTRAST
TECHNIQUE: Multidetector CT imaging of the abdomen and pelvis was performed
using the standard protocol following bolus administration of
intravenous contrast.
CONTRAST:  100mL OMNIPAQUE IOHEXOL 350 MG/ML SOLN

[Series 2: routine with · axial · 0.72mm/px · z∈[-1116,-666]mm · 12 of 102 slices shown, 14 images]
[im 6/102  soft-tissue]
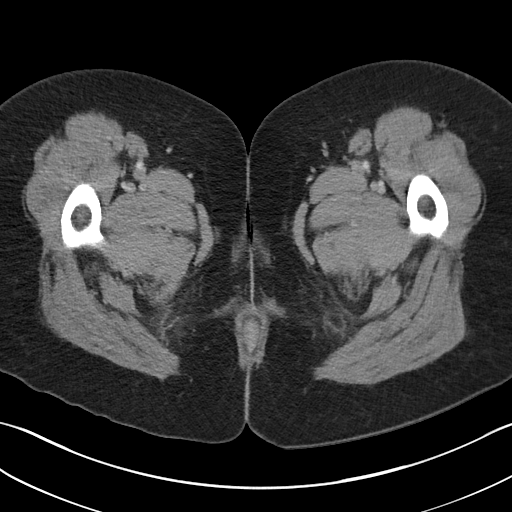
[im 6/102  bone]
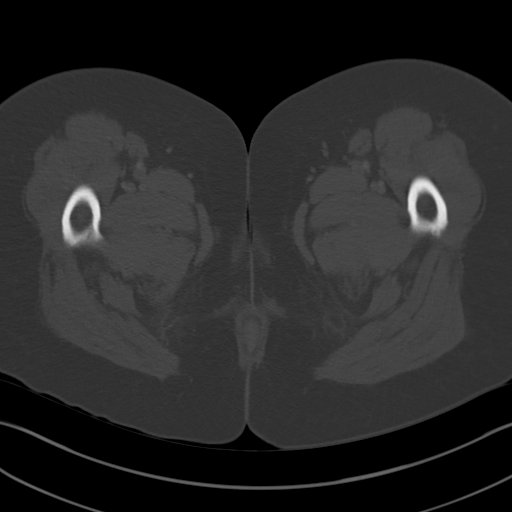
[im 16/102  soft-tissue]
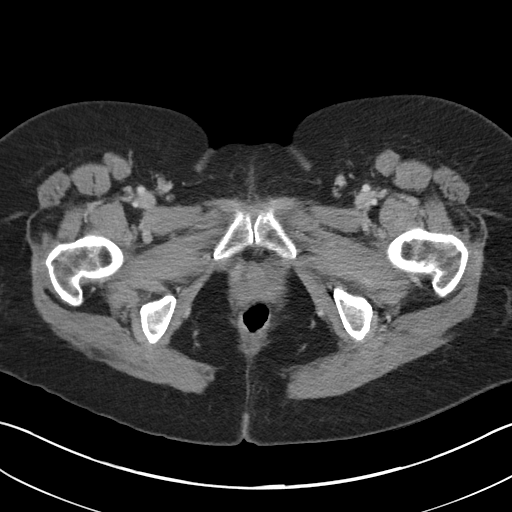
[im 21/102  soft-tissue]
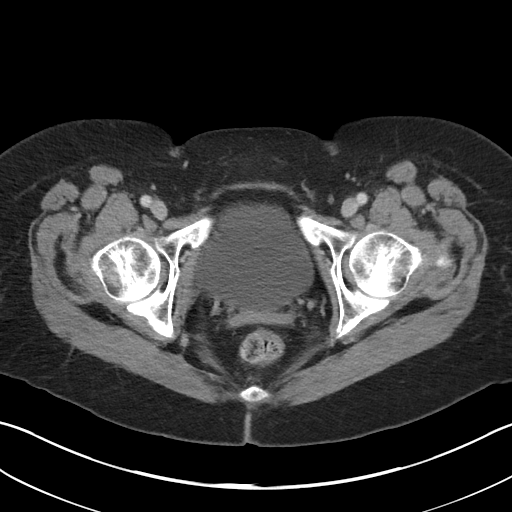
[im 31/102  soft-tissue]
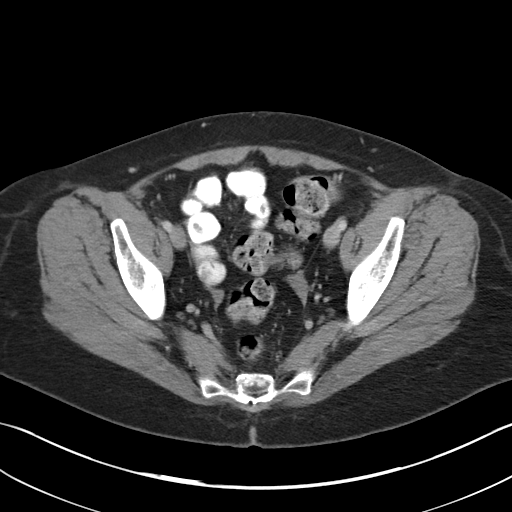
[im 41/102  soft-tissue]
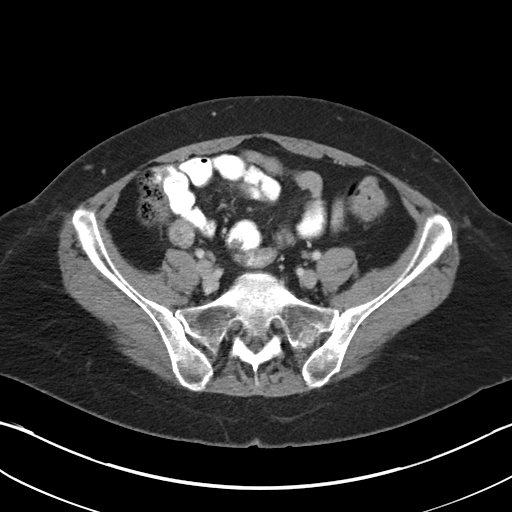
[im 46/102  soft-tissue]
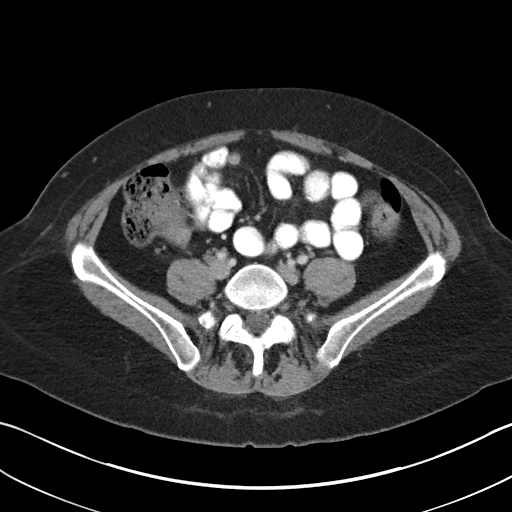
[im 56/102  soft-tissue]
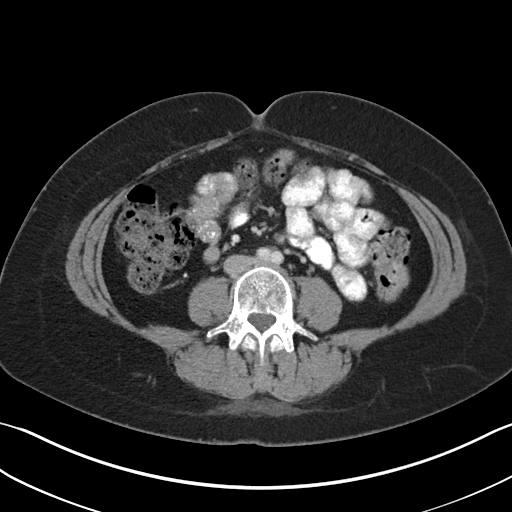
[im 61/102  soft-tissue]
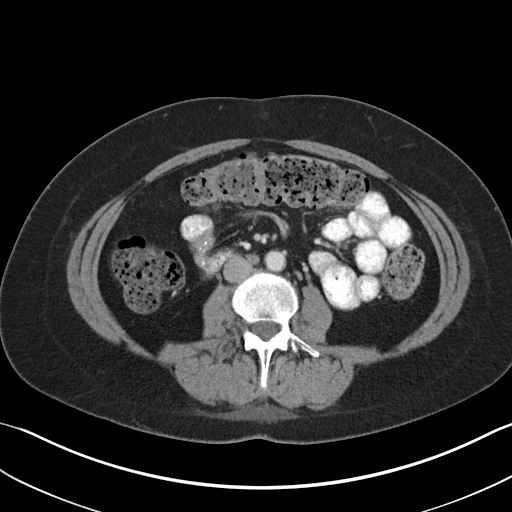
[im 71/102  soft-tissue]
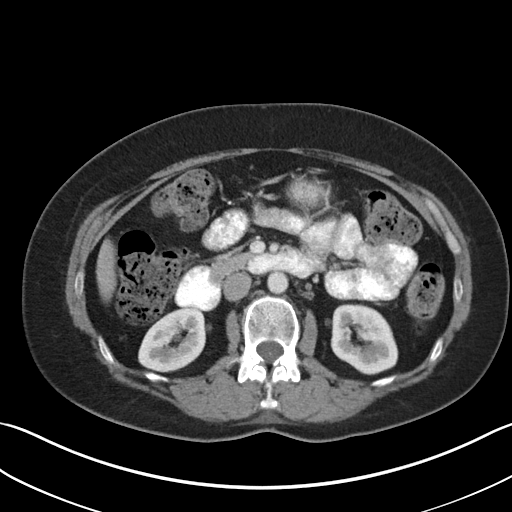
[im 71/102  bone]
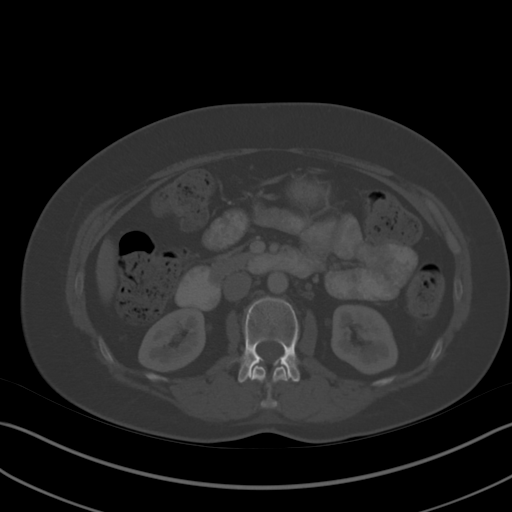
[im 81/102  soft-tissue]
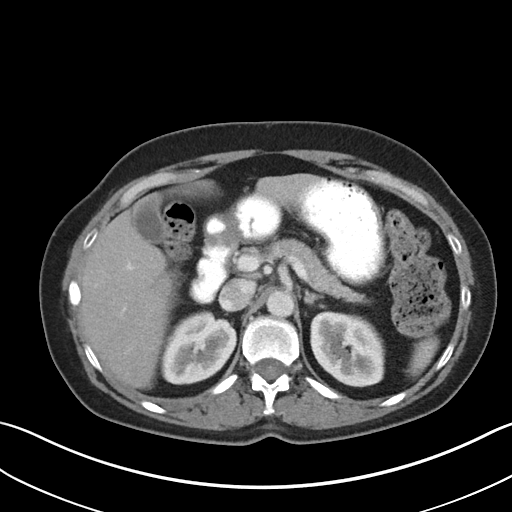
[im 86/102  soft-tissue]
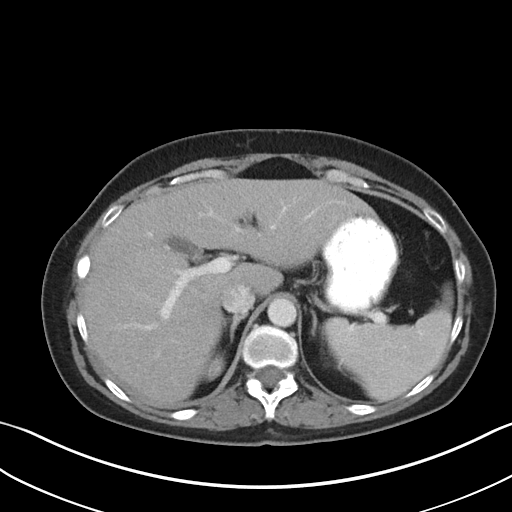
[im 96/102  soft-tissue]
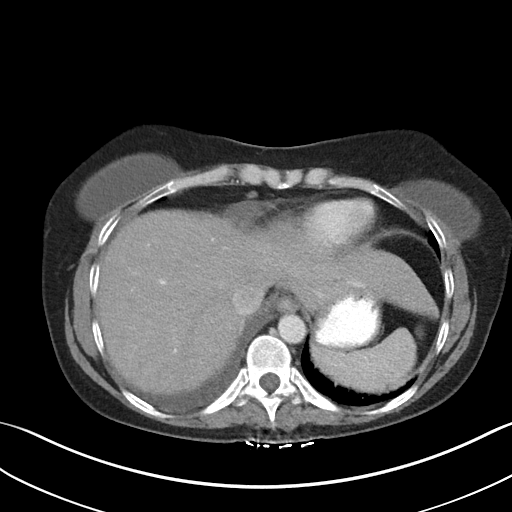

[Series 6: cor routine with · coronal · 0.75mm/px · 3 of 144 slices shown]
[im 48/144  soft-tissue]
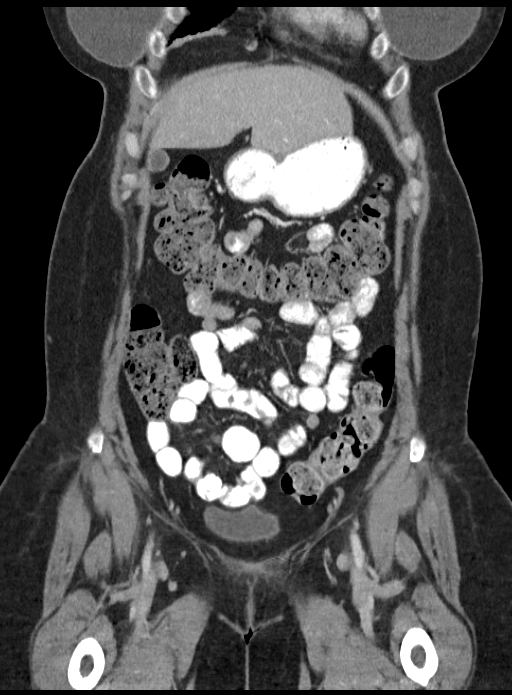
[im 64/144  soft-tissue]
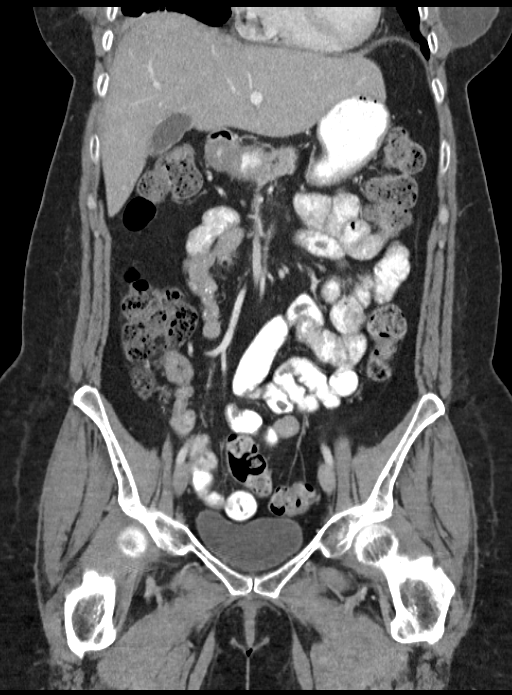
[im 80/144  soft-tissue]
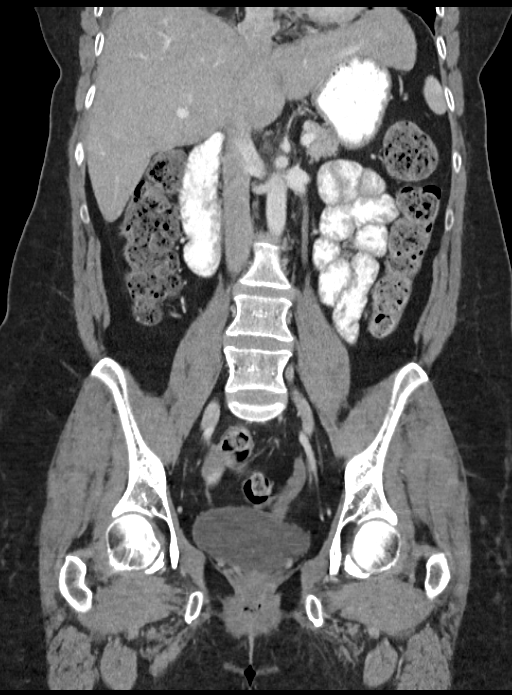

[15 of 46 positions shown; findings below may reference images not displayed]

FINDINGS: Lower chest: New moderate size dependent right pleural effusion
appears simple with associated right lower lobe airspace disease,
likely atelectasis. There is minimal linear atelectasis at the left
lung base. There is no significant left pleural or pericardial
effusion. Bilateral breast implants are noted.

Hepatobiliary: The liver demonstrates probable mild steatosis, but
no focal lesion. No evidence of gallstones, gallbladder wall
thickening or biliary dilatation.

Pancreas: Unremarkable. No pancreatic ductal dilatation or
surrounding inflammatory changes.

Spleen: Normal in size without focal abnormality.

Adrenals/Urinary Tract: Both adrenal glands appear normal.The
kidneys appear normal without evidence of urinary tract calculus,
suspicious lesion or hydronephrosis. No bladder abnormalities are
seen.

Stomach/Bowel: No evidence of bowel wall thickening, distention or
surrounding inflammatory change.The appendix appears normal. There
is moderate stool throughout the colon.

Vascular/Lymphatic: Small portacaval lymph nodes are not
pathologically enlarged. There are no enlarged abdominal pelvic
lymph nodes. Mild aortic atherosclerosis.

Reproductive: Previous hysterectomy.

Other: Probable residual ovarian tissue bilaterally. No evidence of
adnexal mass.

Musculoskeletal: No acute or significant osseous findings. The
sacroiliac joints appear normal.
IMPRESSION: 1. No acute abdominal pelvic findings or explanation for the
patient's symptoms. No evidence of inflammatory bowel disease.
2. Moderate stool throughout the colon suggesting constipation.
3. New moderate size right pleural effusion with associated right
lower lobe airspace disease. Chest radiographic correlation
recommended. Thoracentesis may be helpful for further evaluation.

## 2017-06-18 ENCOUNTER — Ambulatory Visit: Payer: Self-pay | Admitting: Nurse Practitioner

## 2017-09-02 ENCOUNTER — Telehealth: Payer: Self-pay | Admitting: Pharmacy Technician

## 2017-09-02 NOTE — Telephone Encounter (Signed)
[facility] filled initial prescription.  Patient was given new patient packet.  Patient never returned information or scheduled and eligibility appointment.  [facility] unable to provide additional medication assistance until eligibility is determined.  Azad Calame J. Katherine Syme Care Manager [facility] 

## 2019-05-10 ENCOUNTER — Other Ambulatory Visit: Payer: Self-pay | Admitting: Family Medicine

## 2019-05-10 DIAGNOSIS — Z1231 Encounter for screening mammogram for malignant neoplasm of breast: Secondary | ICD-10-CM

## 2019-06-10 DIAGNOSIS — Z86010 Personal history of colonic polyps: Secondary | ICD-10-CM | POA: Insufficient documentation

## 2019-06-19 DIAGNOSIS — R1319 Other dysphagia: Secondary | ICD-10-CM | POA: Insufficient documentation

## 2019-06-24 ENCOUNTER — Ambulatory Visit
Admission: RE | Admit: 2019-06-24 | Discharge: 2019-06-24 | Disposition: A | Discharge status: Home / Self Care | Payer: Medicare Other | Source: Ambulatory Visit | Attending: Family Medicine | Admitting: Family Medicine

## 2019-06-24 ENCOUNTER — Other Ambulatory Visit: Payer: Self-pay | Admitting: Family Medicine

## 2019-06-24 DIAGNOSIS — Z1231 Encounter for screening mammogram for malignant neoplasm of breast: Secondary | ICD-10-CM

## 2019-07-12 ENCOUNTER — Ambulatory Visit
Admit: 2019-07-12 | Discharge: 2019-07-13 | Attending: Student in an Organized Health Care Education/Training Program | Primary: Student in an Organized Health Care Education/Training Program

## 2019-07-12 DIAGNOSIS — L409 Psoriasis, unspecified: Secondary | ICD-10-CM

## 2019-08-19 ENCOUNTER — Ambulatory Visit: Admit: 2019-08-19 | Discharge: 2019-08-20 | Payer: MEDICARE | Attending: Dermatology | Primary: Dermatology

## 2019-08-19 DIAGNOSIS — I781 Nevus, non-neoplastic: Principal | ICD-10-CM

## 2019-08-19 DIAGNOSIS — D229 Melanocytic nevi, unspecified: Principal | ICD-10-CM

## 2019-08-19 DIAGNOSIS — D492 Neoplasm of unspecified behavior of bone, soft tissue, and skin: Principal | ICD-10-CM

## 2019-08-19 DIAGNOSIS — L409 Psoriasis, unspecified: Principal | ICD-10-CM

## 2019-10-21 DIAGNOSIS — I341 Nonrheumatic mitral (valve) prolapse: Secondary | ICD-10-CM | POA: Insufficient documentation

## 2019-11-16 DIAGNOSIS — R0789 Other chest pain: Secondary | ICD-10-CM | POA: Diagnosis not present

## 2019-11-22 DIAGNOSIS — R002 Palpitations: Secondary | ICD-10-CM | POA: Diagnosis not present

## 2019-11-22 DIAGNOSIS — I341 Nonrheumatic mitral (valve) prolapse: Secondary | ICD-10-CM | POA: Diagnosis not present

## 2019-11-25 DIAGNOSIS — L568 Other specified acute skin changes due to ultraviolet radiation: Secondary | ICD-10-CM | POA: Diagnosis not present

## 2019-11-29 ENCOUNTER — Other Ambulatory Visit
Admission: EM | Admit: 2019-11-29 | Discharge: 2019-11-29 | Disposition: A | Discharge status: Home / Self Care | Payer: Medicare HMO | Source: Ambulatory Visit | Attending: Ophthalmology | Admitting: Ophthalmology

## 2019-11-29 DIAGNOSIS — H30893 Other chorioretinal inflammations, bilateral: Secondary | ICD-10-CM | POA: Diagnosis not present

## 2019-11-29 DIAGNOSIS — R002 Palpitations: Secondary | ICD-10-CM | POA: Diagnosis not present

## 2019-11-30 DIAGNOSIS — R55 Syncope and collapse: Secondary | ICD-10-CM | POA: Insufficient documentation

## 2019-12-03 DIAGNOSIS — I341 Nonrheumatic mitral (valve) prolapse: Secondary | ICD-10-CM | POA: Diagnosis not present

## 2019-12-03 DIAGNOSIS — I959 Hypotension, unspecified: Secondary | ICD-10-CM | POA: Diagnosis not present

## 2019-12-03 DIAGNOSIS — R55 Syncope and collapse: Secondary | ICD-10-CM | POA: Diagnosis not present

## 2019-12-06 LAB — MISC LABCORP TEST (SEND OUT): Labcorp test code: 167390

## 2019-12-14 DIAGNOSIS — H33102 Unspecified retinoschisis, left eye: Secondary | ICD-10-CM | POA: Diagnosis not present

## 2019-12-14 DIAGNOSIS — L568 Other specified acute skin changes due to ultraviolet radiation: Secondary | ICD-10-CM | POA: Diagnosis not present

## 2019-12-14 DIAGNOSIS — Z1159 Encounter for screening for other viral diseases: Principal | ICD-10-CM

## 2019-12-14 DIAGNOSIS — Z7289 Other problems related to lifestyle: Principal | ICD-10-CM

## 2019-12-14 DIAGNOSIS — Z79899 Other long term (current) drug therapy: Principal | ICD-10-CM

## 2019-12-15 DIAGNOSIS — Z7289 Other problems related to lifestyle: Secondary | ICD-10-CM | POA: Diagnosis not present

## 2019-12-15 DIAGNOSIS — Z1159 Encounter for screening for other viral diseases: Secondary | ICD-10-CM | POA: Diagnosis not present

## 2019-12-22 DIAGNOSIS — L409 Psoriasis, unspecified: Principal | ICD-10-CM

## 2019-12-22 MED ORDER — ADALIMUMAB 80 MG/0.8 ML (X 1) AND 40 MG/0.4 ML (X 2) SUBCUTAN PEN KIT
PACK | SUBCUTANEOUS | 0 refills | 0.00000 days | Status: CP
Start: 2019-12-22 — End: ?

## 2019-12-22 MED ORDER — ADALIMUMAB PEN CITRATE FREE 40 MG/0.4 ML
PACK | SUBCUTANEOUS | 2 refills | 14.00000 days | Status: CP
Start: 2019-12-22 — End: ?

## 2019-12-23 DIAGNOSIS — L409 Psoriasis, unspecified: Principal | ICD-10-CM

## 2020-01-28 NOTE — Unmapped (Signed)
[facility] Specialty Medication Onboarding    Specialty Medication: HUMIRA LOADING DOSE  Prior Authorization: Approved   Financial Assistance: No - patient must call for financial assistance per MAPs referral AND CALL MAPS BACK FOR MANUFACTURER ASSISTANCE  Final Copay/Day Supply: $1374.83 / 28 DAYS    Insurance Restrictions: Yes - max 1 month supply     Notes to Pharmacist: Referrals closed for Humira loading and maintenance doses by MAPs as they have reached their max number of attempts to patient to obtain additional information to continue manufacturer assistance process.    The triage team has completed the benefits investigation and has determined that the patient is able to fill this medication at [facility]. Please contact the patient to complete the onboarding or follow up with the prescribing physician as needed.      [facility] Specialty Medication Onboarding    Specialty Medication: HUMIRA MAINTENANCE DOSE  Prior Authorization: Approved   Financial Assistance: No - patient must call for financial assistance per MAPs referral    Final Copay/Day Supply: $1897.05 / 28 DAYS    Insurance Restrictions: Yes - max 1 month supply     Notes to Pharmacist: Referrals closed for Humira loading and maintenance doses by MAPs as they have reached their max number of attempts to patient to obtain additional information to continue manufacturer assistance process.    The triage team has completed the benefits investigation and has determined that the patient is able to fill this medication at [facility]. Please contact the patient to complete the onboarding or follow up with the prescribing physician as needed.

## 2020-02-09 NOTE — Unmapped (Addendum)
Update: Dr. Dorna Bloom OK with plan. Will d/c for now, but patient able to call pharmacy if she'd like to enroll in assistance or set up payment plan. Starter pack cost ~$1375. Mas    This patient has been disenrolled from the [facility] specialty pharmacy services due to inability of the pharmacy to reach the patient.    Lanney Gins  [facility] Specialty Pharmacist    ----------------------------------------------------------------------------------------------    Called and left messages on 4/15 and 4/21 to check in with [place] on her high copay for Humira. It appears she has not returned calls to MAPs team, but I wanted to make sure she understood the process.    I'll tag Dr. Dorna Bloom so he's aware we'll close out her file for now, but if Amire would like to pursue assistance or use a payment plan for her medication, she may call us back at [phone], option 4.    Legrand Lasser A. Katrinka Blazing, PharmD, BCPS - Pharmacist   [facility]

## 2020-03-01 DIAGNOSIS — Z0000 Encounter for general adult medical examination without abnormal findings: Secondary | ICD-10-CM | POA: Diagnosis not present

## 2020-03-08 ENCOUNTER — Other Ambulatory Visit: Payer: Self-pay | Admitting: Family Medicine

## 2020-03-08 DIAGNOSIS — R1031 Right lower quadrant pain: Secondary | ICD-10-CM

## 2020-03-08 DIAGNOSIS — R10829 Rebound abdominal tenderness, unspecified site: Secondary | ICD-10-CM

## 2020-03-08 DIAGNOSIS — R198 Other specified symptoms and signs involving the digestive system and abdomen: Secondary | ICD-10-CM | POA: Diagnosis not present

## 2020-03-08 DIAGNOSIS — M2550 Pain in unspecified joint: Secondary | ICD-10-CM | POA: Diagnosis not present

## 2020-03-08 DIAGNOSIS — L4050 Arthropathic psoriasis, unspecified: Secondary | ICD-10-CM | POA: Diagnosis not present

## 2020-03-14 ENCOUNTER — Ambulatory Visit
Admission: RE | Admit: 2020-03-14 | Discharge: 2020-03-14 | Disposition: A | Discharge status: Home / Self Care | Payer: Medicare HMO | Source: Ambulatory Visit | Attending: Family Medicine | Admitting: Family Medicine

## 2020-03-14 ENCOUNTER — Other Ambulatory Visit: Payer: Self-pay

## 2020-03-14 DIAGNOSIS — R109 Unspecified abdominal pain: Secondary | ICD-10-CM | POA: Diagnosis not present

## 2020-03-14 DIAGNOSIS — R10829 Rebound abdominal tenderness, unspecified site: Secondary | ICD-10-CM | POA: Diagnosis not present

## 2020-03-14 DIAGNOSIS — R198 Other specified symptoms and signs involving the digestive system and abdomen: Secondary | ICD-10-CM | POA: Diagnosis not present

## 2020-03-14 DIAGNOSIS — R1031 Right lower quadrant pain: Secondary | ICD-10-CM | POA: Insufficient documentation

## 2020-03-14 MED ORDER — IOHEXOL 300 MG/ML  SOLN
100.0000 mL | Freq: Once | INTRAMUSCULAR | Status: AC | PRN
  Administered 2020-03-14: 100 mL via INTRAVENOUS

## 2020-04-05 DIAGNOSIS — H5461 Unqualified visual loss, right eye, normal vision left eye: Secondary | ICD-10-CM | POA: Diagnosis not present

## 2020-04-05 DIAGNOSIS — Z87891 Personal history of nicotine dependence: Secondary | ICD-10-CM | POA: Diagnosis not present

## 2020-04-05 DIAGNOSIS — R6889 Other general symptoms and signs: Secondary | ICD-10-CM | POA: Diagnosis not present

## 2020-04-05 DIAGNOSIS — R1084 Generalized abdominal pain: Secondary | ICD-10-CM | POA: Diagnosis not present

## 2020-05-29 ENCOUNTER — Other Ambulatory Visit: Payer: Self-pay

## 2020-05-30 ENCOUNTER — Encounter: Payer: Self-pay | Admitting: *Deleted

## 2020-05-30 ENCOUNTER — Ambulatory Visit: Payer: Medicare HMO | Admitting: Gastroenterology

## 2020-08-08 DIAGNOSIS — M62830 Muscle spasm of back: Secondary | ICD-10-CM | POA: Diagnosis not present

## 2020-08-08 DIAGNOSIS — R1031 Right lower quadrant pain: Secondary | ICD-10-CM | POA: Diagnosis not present

## 2020-08-22 DIAGNOSIS — R109 Unspecified abdominal pain: Secondary | ICD-10-CM | POA: Diagnosis not present

## 2020-08-22 DIAGNOSIS — M62830 Muscle spasm of back: Secondary | ICD-10-CM | POA: Diagnosis not present

## 2020-08-22 DIAGNOSIS — M549 Dorsalgia, unspecified: Secondary | ICD-10-CM | POA: Diagnosis not present

## 2020-09-04 DIAGNOSIS — S29019A Strain of muscle and tendon of unspecified wall of thorax, initial encounter: Secondary | ICD-10-CM | POA: Diagnosis not present

## 2020-09-04 DIAGNOSIS — M5489 Other dorsalgia: Secondary | ICD-10-CM | POA: Diagnosis not present

## 2020-09-22 ENCOUNTER — Other Ambulatory Visit: Payer: Self-pay

## 2020-09-25 ENCOUNTER — Encounter: Payer: Self-pay | Admitting: Gastroenterology

## 2020-09-25 ENCOUNTER — Telehealth: Payer: Self-pay

## 2020-09-25 ENCOUNTER — Other Ambulatory Visit: Payer: Self-pay

## 2020-09-25 ENCOUNTER — Other Ambulatory Visit: Payer: Self-pay | Admitting: Gastroenterology

## 2020-09-25 ENCOUNTER — Ambulatory Visit: Payer: Medicare HMO | Admitting: Gastroenterology

## 2020-09-25 VITALS — BP 103/71 | HR 77 | Temp 97.9°F | Wt 174.6 lb

## 2020-09-25 DIAGNOSIS — R195 Other fecal abnormalities: Secondary | ICD-10-CM | POA: Diagnosis not present

## 2020-09-25 DIAGNOSIS — R1011 Right upper quadrant pain: Secondary | ICD-10-CM

## 2020-09-25 MED ORDER — LINACLOTIDE 145 MCG PO CAPS: 145.0000 ug | ORAL_CAPSULE | Freq: Every day | ORAL | 0 refills | Status: AC

## 2020-09-25 MED ORDER — FAMOTIDINE 20 MG PO TABS: 20.0000 mg | ORAL_TABLET | Freq: Two times a day (BID) | ORAL | 1 refills | Status: DC

## 2020-09-25 NOTE — Telephone Encounter (Signed)
Called patient to let her know that Dr. Maximino Greenland had ordered an ultrasound for her since she was having RUQ abdominal pain at [facility]. Patient was instructed to arrive at 9:30 AM and to not have nothing to eat or drink from midnight the night before. Patient understood and had no further questions. Patient was also informed that an additional lab was ordered for her.

## 2020-09-25 NOTE — Patient Instructions (Signed)
Please discontinue taking Prilosec and start your new prescriptions.

## 2020-09-25 NOTE — Progress Notes (Signed)
Margaret Diaz [address]  [place]  [place],  [address]  Main: [phone]  Fax: [phone]   Gastroenterology Consultation  Referring Provider:     [place] Pink, MD Primary Care Physician:  Maryland Pink, MD Reason for Consultation:     Abdominal pain        HPI:    Chief Complaint  Patient presents with  . RUQ pain    Patient stated that she had been having abdominal pain for several months.    Margaret Diaz is a 54 y.o. y/o female referred for consultation & management  by Dr. [place] Pink, MD.  Patient reports chronic GI issues with abdominal pain, alternating constipation and diarrhea, previously evaluated by [facility] clinic GI, most recently in 2020, now being referred to [place] for the same symptoms.  Dull, cramping pain, 5/10, nonradiating.  Most recently in the right upper quadrant.  Referral from PCP does not have any labs or imaging included with it.  No nausea or vomiting.  Was previously on PPI but states it gave her diarrhea so she stopped taking it.  Takes Rolaids as needed.  States she goes days without a bowel movement at times, then will have a hard bowel movement, followed by multiple loose bowel movements, this is her usual pattern.  She is unable to tell me if she has more days with diarrhea or constipation.  Has previously had EGD and colonoscopy with [facility] clinic.  I personally reviewed their notes and pertinent records are as follows:  "06/24/13: EGD (indic: epigastric pain, dysphagia, heartburn) - LA-A reflux esophagitis. Minimal chronic gastritis in body and antrum. Normal duodenum. : CSY (inidc: rectal bleeding, PHx polyps) - Small internal hemorrhoids. -- 10/11/14: ANA WNL. -- 03/16/15: H pylori ab negative. -- 03/23/15: CRP 10.9.  : ESR, GGT, 5 nucleotidase, AMA, ASMA, hepatitis panel negative/WNL. -- 06/05/15: ANA WNL. -- 06/30/15: CT A/P w/ contrast (indic: abd pain, difficulty voiding) - No intra-abdominal abnormalities. --  06/10/19: H pylori breath WNL. -- 07/22/19: EGD (indic: abd pain) - Irregular Z-line (chronic inflammation of gastric cardia, neg Barrett's). 2 cm hiatal hernia. Mild chronic gastritis in antrum. Normal duodenum. : CSY (indic: PHx polyps) - Normal colon. Grade I internal hemorrhoids. Repeat in 07/2024. "  In November 2020 they also ordered fecal calprotectin which was borderline elevated at 107 with borderline elevation range being from 50-120.  GI panel was negative for infectious markers including C. difficile toxin a and B.  Pancreatic elastase was normal at 370.  Their note does state that no biopsies were done for microscopic colitis on her previous colonoscopy.    Past Medical History:  Diagnosis Date  . Arthritis   . Dyspnea 05/10/2015   05/10/2015  Walked RA x 3 laps @ 185 ft each stopped due to  End of study, nl pace, no sob or desat  > dizzy at end    . GERD (gastroesophageal reflux disease)   . Hx of bilateral breast implants   . Irritable bowel syndrome (IBS)   . Pleural effusion on right 04/10/2015   Followed in Pulmonary clinic/ [facility]/ [facility]  - R thoracentesis 04/11/2015  550 cc > exudate with nl glucose ACUTE INFLAMMATION AND EOSINOPHILS. - ESR 05/10/2015 =30/ no significant peripheral eos      Past Surgical History:  Procedure Laterality Date  . ABDOMINAL HYSTERECTOMY    . AUGMENTATION MAMMAPLASTY Bilateral 1999  . BREAST ENHANCEMENT SURGERY  1999    Prior to Admission  medications   Medication Sig Start Date End Date Taking? Authorizing Provider  Adalimumab 40 MG/0.4ML PNKT Inject 0.4 mLs into the skin every 14 (fourteen) days. 12/22/19  Yes [provider]  Cholecalciferol (VITAMIN D3) 2000 units capsule Take 1 capsule (2,000 Units total) by mouth daily. 01/01/17  Yes Milinda Pointer, MD  diazepam (VALIUM) 5 MG tablet Take 1 tablet by mouth in the morning and at bedtime. 09/21/20  Yes [provider]  diphenhydrAMINE (SOMINEX) 25 MG tablet Take 1  tablet by mouth daily.   Yes [provider]  DULoxetine (CYMBALTA) 60 MG capsule Take 60 mg by mouth daily.   Yes [provider]  hydrOXYzine (ATARAX/VISTARIL) 25 MG tablet Take 25 mg by mouth 3 (three) times daily as needed.   Yes [provider]  Hyoscyamine Sulfate SL 0.125 MG SUBL Take 1-2 tablets by mouth every 4 (four) hours as needed. 10/24/19  Yes [provider]  mirtazapine (REMERON) 30 MG tablet Take 0.5 tablets by mouth 1 day or 1 dose. 04/10/20  Yes [provider]  omeprazole (PRILOSEC) 40 MG capsule Take 40 mg by mouth 2 (two) times daily.   Yes [provider]  diphenhydrAMINE (BENADRYL) 25 MG tablet Take 25 mg by mouth every 6 (six) hours as needed. Patient not taking: Reported on 09/25/2020    [provider]    Family History  Problem Relation Age of Onset  . Mental illness Mother   . Breast cancer Mother 14  . Breast cancer Maternal Aunt 24  . Breast cancer Maternal Aunt 67     Social History   Tobacco Use  . Smoking status: Former Smoker    Packs/day: 0.50    Years: 6.00    Pack years: 3.00    Types: Cigarettes    Quit date: 11/23/2011    Years since quitting: 8.8  . Smokeless tobacco: Never Used  Substance Use Topics  . Alcohol use: No    Alcohol/week: 0.0 standard drinks  . Drug use: No    Allergies as of 09/25/2020 - Review Complete 09/25/2020  Allergen Reaction Noted  . Erythromycin ethylsuccinate Nausea And Vomiting 03/09/2014  . Erythromycin Nausea And Vomiting 03/30/2015  . Hydromorphone Swelling 05/24/2014  . Hydromorphone hcl Swelling 04/10/2015  . Meperidine Other (See Comments) 03/09/2014    Review of Systems:    All systems reviewed and negative except where noted in HPI.   Physical Exam:  BP 103/71   Pulse 77   Temp 97.9 F (36.6 C) (Oral)   Wt 174 lb 9.6 oz (79.2 kg)   LMP 06/29/1998   BMI 28.18 kg/m  Patient's last menstrual period was 06/29/1998. Psych:  Alert and  cooperative. Normal mood and affect. General:   Alert,  Well-developed, well-nourished, pleasant and cooperative in NAD Head:  Normocephalic and atraumatic. Eyes:  Sclera clear, no icterus.   Conjunctiva pink. Ears:  Normal auditory acuity. Nose:  No deformity, discharge, or lesions. Mouth:  No deformity or lesions,oropharynx pink & moist. Neck:  Supple; no masses or thyromegaly. Abdomen:  Normal bowel sounds.  No bruits.  Soft, non-tender and non-distended without masses, hepatosplenomegaly or hernias noted.  No guarding or rebound tenderness.    Msk:  Symmetrical without gross deformities. Good, equal movement & strength bilaterally. Pulses:  Normal pulses noted. Extremities:  No clubbing or edema.  No cyanosis. Neurologic:  Alert and oriented x3;  grossly normal neurologically. Skin:  Intact without significant lesions or rashes. No jaundice. Lymph Nodes:  No significant cervical adenopathy. Psych:  Alert and cooperative. Normal mood and affect.   Labs: CBC    Component Value Date/Time   WBC 8.7 06/30/2015 1651   RBC 4.55 06/30/2015 1651   HGB 12.8 06/30/2015 1651   HGB 12.8 02/06/2015 1229   HCT 39.2 06/30/2015 1651   HCT 38.5 02/06/2015 1229   PLT 249 06/30/2015 1651   PLT 271 02/06/2015 1229   MCV 86.1 06/30/2015 1651   MCV 88 02/06/2015 1229   MCH 28.1 06/30/2015 1651   MCHC 32.7 06/30/2015 1651   RDW 16.4 (H) 06/30/2015 1651   RDW 13.3 02/06/2015 1229   LYMPHSABS 2.5 06/30/2015 1651   MONOABS 0.8 06/30/2015 1651   EOSABS 0.3 06/30/2015 1651   BASOSABS 0.1 06/30/2015 1651   CMP     Component Value Date/Time   NA 139 12/19/2016 1012   NA 137 02/06/2015 1229   K 4.6 12/19/2016 1012   K 3.7 02/06/2015 1229   CL 103 12/19/2016 1012   CL 103 02/06/2015 1229   CO2 29 12/19/2016 1012   CO2 26 02/06/2015 1229   GLUCOSE 98 12/19/2016 1012   GLUCOSE 108 (H) 02/06/2015 1229   BUN 15 12/19/2016 1012   BUN 13 02/06/2015 1229   CREATININE 0.85 12/19/2016 1012    CREATININE 0.58 02/06/2015 1229   CALCIUM 9.6 12/19/2016 1012   CALCIUM 8.7 (L) 02/06/2015 1229   PROT 7.8 12/19/2016 1012   PROT 6.7 02/06/2015 1229   ALBUMIN 4.4 12/19/2016 1012   ALBUMIN 3.7 02/06/2015 1229   AST 21 12/19/2016 1012   AST 31 02/06/2015 1229   ALT 17 12/19/2016 1012   ALT 24 02/06/2015 1229   ALKPHOS 107 12/19/2016 1012   ALKPHOS 98 02/06/2015 1229   BILITOT 0.4 12/19/2016 1012   BILITOT 0.3 02/06/2015 1229   GFRNONAA >60 12/19/2016 1012   GFRNONAA >60 02/06/2015 1229   GFRAA >60 12/19/2016 1012   GFRAA >60 02/06/2015 1229    Imaging Studies: No results found.  Assessment and Plan:   Margaret Diaz is a 54 y.o. y/o female has been referred for abdominal pain, constipation and diarrhea  Due to location of pain now being in the right upper quadrant, will obtain right upper quadrant ultrasound and CMP  However, patient symptoms are likely chronic and functional in nature and this was discussed with her  Start Pepcid twice daily.  Avoid PPI as patient reports diarrhea with oMeprazole.  However, if PPI is needed in the future, consider alternative PPI than omeprazole  Her bowel movements seem to start as constipation, followed by hard stool once she does finally move her bowels followed by loose stools.  I do not think her symptoms are true diarrhea.  However, since fecal calprotectin was borderline elevated, will repeat at this time  Start Linzess due to multiple days without bowel movements and reassess symptoms  If symptoms continue or fecal calprotectin is abnormal, consider endoscopy    Dr Margaret Diaz  Speech recognition software was used to dictate the above note.

## 2020-09-29 ENCOUNTER — Ambulatory Visit: Payer: Medicare HMO | Attending: Gastroenterology

## 2020-12-26 ENCOUNTER — Encounter: Payer: Self-pay | Admitting: *Deleted

## 2020-12-26 ENCOUNTER — Ambulatory Visit: Payer: Medicare HMO | Admitting: Gastroenterology

## 2021-01-02 DIAGNOSIS — R6889 Other general symptoms and signs: Secondary | ICD-10-CM | POA: Diagnosis not present

## 2021-01-02 DIAGNOSIS — R42 Dizziness and giddiness: Secondary | ICD-10-CM | POA: Diagnosis not present

## 2021-01-08 ENCOUNTER — Encounter: Payer: Self-pay | Admitting: Gastroenterology

## 2021-01-08 ENCOUNTER — Other Ambulatory Visit: Payer: Self-pay

## 2021-01-08 ENCOUNTER — Ambulatory Visit (INDEPENDENT_AMBULATORY_CARE_PROVIDER_SITE_OTHER): Payer: Medicare HMO | Admitting: Gastroenterology

## 2021-01-08 VITALS — BP 119/72 | HR 80 | Temp 97.8°F | Wt 176.0 lb

## 2021-01-08 DIAGNOSIS — K5900 Constipation, unspecified: Secondary | ICD-10-CM

## 2021-01-08 NOTE — Progress Notes (Signed)
Margaret Antigua, MD [address]  [place]  [place], [place] [address]  Main: [phone]  Fax: [phone]   Primary Care Physician: Margaret Pink, MD   Chief Complaint  Patient presents with  . Abdominal Pain    RUQ    HPI: Margaret Diaz is a 55 y.o. female here for follow-up of abdominal pain.  Patient was prescribed Linzess on last visit and she states with this her abdominal pain has completely resolved and her bowel movements are much better.  She was having days without bowel movement, followed by loose bowel movements after that and was having abdominal pain.  Now she is taking Linzess every other day as she states every day dosing is too much for her and with this she is having a bowel movement every day or every other day with no further abdominal pain, diarrhea or constipation.  No blood in stool.  No nausea or vomiting.  No weight loss.  Good appetite.  Previous history: "06/24/13: EGD (indic: epigastric pain, dysphagia, heartburn) - LA-A reflux esophagitis. Minimal chronic gastritis in body and antrum. Normal duodenum. : CSY (inidc: rectal bleeding, PHx polyps) - Small internal hemorrhoids. -- 10/11/14: ANA WNL. -- 03/16/15: H pylori ab negative. -- 03/23/15: CRP 10.9.  : ESR, GGT, 5 nucleotidase, AMA, ASMA, hepatitis panel negative/WNL. -- 06/05/15: ANA WNL. -- 06/30/15: CT A/P w/ contrast (indic: abd pain, difficulty voiding) - No intra-abdominal abnormalities. -- 06/10/19: H pylori breath WNL. -- 07/22/19: EGD (indic: abd pain) - Irregular Z-line (chronic inflammation of gastric cardia, neg Barrett's). 2 cm hiatal hernia. Mild chronic gastritis in antrum. Normal duodenum. : CSY (indic: PHx polyps) - Normal colon. Grade I internal hemorrhoids. Repeat in 07/2024. "  In November 2020 they also ordered fecal calprotectin which was borderline elevated at 107 with borderline elevation range being from 50-120.  GI panel was negative for infectious markers including C.  difficile toxin a and B.  Pancreatic elastase was normal at 370.  Their note does state that no biopsies were done for microscopic colitis on her previous colonoscopy.  Current Outpatient Medications  Medication Sig Dispense Refill  . Cholecalciferol (VITAMIN D3) 2000 units capsule Take 1 capsule (2,000 Units total) by mouth daily. 30 capsule PRN  . diazepam (VALIUM) 5 MG tablet Take 1 tablet by mouth in the morning and at bedtime.    . diphenhydrAMINE (BENADRYL) 25 MG tablet Take 25 mg by mouth every 6 (six) hours as needed.    . DULoxetine (CYMBALTA) 60 MG capsule Take 60 mg by mouth daily.    . hydrOXYzine (ATARAX/VISTARIL) 25 MG tablet Take 25 mg by mouth 3 (three) times daily as needed.    . linaclotide (LINZESS) 145 MCG CAPS capsule Take 1 capsule (145 mcg total) by mouth daily. 90 capsule 0  . mirtazapine (REMERON) 30 MG tablet Take 0.5 tablets by mouth 1 day or 1 dose.    Marland Kitchen omeprazole (PRILOSEC) 40 MG capsule Take 40 mg by mouth 2 (two) times daily.    . Adalimumab 40 MG/0.4ML PNKT Inject 0.4 mLs into the skin every 14 (fourteen) days. (Patient not taking: Reported on 01/08/2021)    . Hyoscyamine Sulfate SL 0.125 MG SUBL Take 1-2 tablets by mouth every 4 (four) hours as needed. (Patient not taking: Reported on 01/08/2021)     No current facility-administered medications for this visit.    Allergies as of 01/08/2021 - Review Complete 01/08/2021  Allergen Reaction Noted  . Erythromycin ethylsuccinate Nausea  And Vomiting 03/09/2014  . Erythromycin Nausea And Vomiting 03/30/2015  . Hydromorphone Swelling 05/24/2014  . Hydromorphone hcl Swelling 04/10/2015  . Meperidine Other (See Comments) 03/09/2014    ROS:  General: Negative for anorexia, weight loss, fever, chills, fatigue, weakness. ENT: Negative for hoarseness, difficulty swallowing , nasal congestion. CV: Negative for chest pain, angina, palpitations, dyspnea on exertion, peripheral edema.  Respiratory: Negative for dyspnea at  rest, dyspnea on exertion, cough, sputum, wheezing.  GI: See history of present illness. GU:  Negative for dysuria, hematuria, urinary incontinence, urinary frequency, nocturnal urination.  Endo: Negative for unusual weight change.    Physical Examination:   BP 119/72   Pulse 80   Temp 97.8 F (36.6 C) (Oral)   Wt 176 lb (79.8 kg)   LMP 06/29/1998   BMI 28.41 kg/m   General: Well-nourished, well-developed in no acute distress.  Eyes: No icterus. Conjunctivae Diaz. Mouth: Oropharyngeal mucosa moist and Diaz , no lesions erythema or exudate. Neck: Supple, Trachea midline Abdomen: Bowel sounds are normal, nontender, nondistended, no hepatosplenomegaly or masses, no abdominal bruits or hernia , no rebound or guarding.   Extremities: No lower extremity edema. No clubbing or deformities. Neuro: Alert and oriented x 3.  Grossly intact. Skin: Warm and dry, no jaundice.   Psych: Alert and cooperative, normal mood and affect.   Labs: CMP     Component Value Date/Time   NA 139 12/19/2016 1012   NA 137 02/06/2015 1229   K 4.6 12/19/2016 1012   K 3.7 02/06/2015 1229   CL 103 12/19/2016 1012   CL 103 02/06/2015 1229   CO2 29 12/19/2016 1012   CO2 26 02/06/2015 1229   GLUCOSE 98 12/19/2016 1012   GLUCOSE 108 (H) 02/06/2015 1229   BUN 15 12/19/2016 1012   BUN 13 02/06/2015 1229   CREATININE 0.85 12/19/2016 1012   CREATININE 0.58 02/06/2015 1229   CALCIUM 9.6 12/19/2016 1012   CALCIUM 8.7 (L) 02/06/2015 1229   PROT 7.8 12/19/2016 1012   PROT 6.7 02/06/2015 1229   ALBUMIN 4.4 12/19/2016 1012   ALBUMIN 3.7 02/06/2015 1229   AST 21 12/19/2016 1012   AST 31 02/06/2015 1229   ALT 17 12/19/2016 1012   ALT 24 02/06/2015 1229   ALKPHOS 107 12/19/2016 1012   ALKPHOS 98 02/06/2015 1229   BILITOT 0.4 12/19/2016 1012   BILITOT 0.3 02/06/2015 1229   GFRNONAA >60 12/19/2016 1012   GFRNONAA >60 02/06/2015 1229   GFRAA >60 12/19/2016 1012   GFRAA >60 02/06/2015 1229   Lab Results   Component Value Date   WBC 8.7 06/30/2015   HGB 12.8 06/30/2015   HCT 39.2 06/30/2015   MCV 86.1 06/30/2015   PLT 249 06/30/2015    Imaging Studies: No results found.  Assessment and Plan:   Margaret Diaz is a 55 y.o. y/o female here for follow-up of abdominal pain and constipation  Both abdominal pain and constipation have completely resolved with Linzess that she is taking every other day at this time  If symptoms recur patient vies to call us back and she verbalized understanding  Labs otherwise reassuring  Colonoscopy up-to-date  Right upper quadrant ultrasound was ordered along with labs on last visit.  Patient did not have these done as her abdominal symptoms have resolved.  Patient willing to get these done if symptoms come back  Dr Margaret Diaz

## 2021-02-26 DIAGNOSIS — R6889 Other general symptoms and signs: Secondary | ICD-10-CM | POA: Diagnosis not present

## 2021-02-26 DIAGNOSIS — T671XXA Heat syncope, initial encounter: Secondary | ICD-10-CM | POA: Diagnosis not present

## 2021-02-26 DIAGNOSIS — R42 Dizziness and giddiness: Secondary | ICD-10-CM | POA: Diagnosis not present

## 2021-02-27 ENCOUNTER — Other Ambulatory Visit: Payer: Self-pay | Admitting: Neurology

## 2021-02-27 ENCOUNTER — Other Ambulatory Visit (HOSPITAL_COMMUNITY): Payer: Self-pay | Admitting: Neurology

## 2021-02-27 DIAGNOSIS — Z87828 Personal history of other (healed) physical injury and trauma: Secondary | ICD-10-CM

## 2021-03-01 ENCOUNTER — Ambulatory Visit: Admission: RE | Admit: 2021-03-01 | Payer: Medicare HMO | Source: Ambulatory Visit

## 2021-03-17 ENCOUNTER — Other Ambulatory Visit: Payer: Self-pay

## 2021-03-17 ENCOUNTER — Ambulatory Visit
Admission: RE | Admit: 2021-03-17 | Discharge: 2021-03-17 | Disposition: A | Discharge status: Home / Self Care | Payer: Medicare HMO | Source: Ambulatory Visit | Attending: Neurology | Admitting: Neurology

## 2021-03-17 DIAGNOSIS — Z87828 Personal history of other (healed) physical injury and trauma: Secondary | ICD-10-CM | POA: Insufficient documentation

## 2021-03-17 DIAGNOSIS — G9389 Other specified disorders of brain: Secondary | ICD-10-CM | POA: Diagnosis not present

## 2021-04-22 ENCOUNTER — Other Ambulatory Visit: Payer: Self-pay | Admitting: Gastroenterology

## 2021-05-02 DIAGNOSIS — R55 Syncope and collapse: Secondary | ICD-10-CM | POA: Diagnosis not present

## 2021-05-02 DIAGNOSIS — R6889 Other general symptoms and signs: Secondary | ICD-10-CM | POA: Diagnosis not present

## 2021-05-02 DIAGNOSIS — R42 Dizziness and giddiness: Secondary | ICD-10-CM | POA: Diagnosis not present

## 2021-07-04 DIAGNOSIS — G479 Sleep disorder, unspecified: Secondary | ICD-10-CM | POA: Diagnosis not present

## 2021-07-04 DIAGNOSIS — Z87828 Personal history of other (healed) physical injury and trauma: Secondary | ICD-10-CM | POA: Diagnosis not present

## 2021-07-04 DIAGNOSIS — R55 Syncope and collapse: Secondary | ICD-10-CM | POA: Diagnosis not present

## 2021-07-04 DIAGNOSIS — R6889 Other general symptoms and signs: Secondary | ICD-10-CM | POA: Diagnosis not present

## 2021-07-04 DIAGNOSIS — R42 Dizziness and giddiness: Secondary | ICD-10-CM | POA: Diagnosis not present

## 2021-09-05 DIAGNOSIS — K582 Mixed irritable bowel syndrome: Secondary | ICD-10-CM | POA: Diagnosis not present

## 2021-09-05 DIAGNOSIS — K580 Irritable bowel syndrome with diarrhea: Secondary | ICD-10-CM | POA: Diagnosis not present

## 2021-09-05 DIAGNOSIS — G4700 Insomnia, unspecified: Secondary | ICD-10-CM | POA: Diagnosis not present

## 2021-09-05 DIAGNOSIS — R1084 Generalized abdominal pain: Secondary | ICD-10-CM | POA: Diagnosis not present

## 2021-09-05 DIAGNOSIS — M792 Neuralgia and neuritis, unspecified: Secondary | ICD-10-CM | POA: Diagnosis not present

## 2021-09-25 DIAGNOSIS — R6889 Other general symptoms and signs: Secondary | ICD-10-CM | POA: Diagnosis not present

## 2021-09-25 DIAGNOSIS — G479 Sleep disorder, unspecified: Secondary | ICD-10-CM | POA: Diagnosis not present

## 2021-09-25 DIAGNOSIS — R208 Other disturbances of skin sensation: Secondary | ICD-10-CM | POA: Diagnosis not present

## 2021-09-25 DIAGNOSIS — R42 Dizziness and giddiness: Secondary | ICD-10-CM | POA: Diagnosis not present

## 2021-11-13 DIAGNOSIS — R509 Fever, unspecified: Secondary | ICD-10-CM | POA: Diagnosis not present

## 2021-11-13 DIAGNOSIS — R232 Flushing: Secondary | ICD-10-CM | POA: Diagnosis not present

## 2021-11-13 DIAGNOSIS — R42 Dizziness and giddiness: Secondary | ICD-10-CM | POA: Diagnosis not present

## 2021-12-18 DIAGNOSIS — R202 Paresthesia of skin: Secondary | ICD-10-CM | POA: Diagnosis not present

## 2021-12-18 DIAGNOSIS — R200 Anesthesia of skin: Secondary | ICD-10-CM | POA: Diagnosis not present

## 2022-03-27 DIAGNOSIS — R202 Paresthesia of skin: Secondary | ICD-10-CM | POA: Diagnosis not present

## 2022-03-27 DIAGNOSIS — R200 Anesthesia of skin: Secondary | ICD-10-CM | POA: Diagnosis not present

## 2022-05-02 DIAGNOSIS — R42 Dizziness and giddiness: Secondary | ICD-10-CM | POA: Diagnosis not present

## 2022-05-20 DIAGNOSIS — R6889 Other general symptoms and signs: Secondary | ICD-10-CM | POA: Diagnosis not present

## 2022-05-20 DIAGNOSIS — N644 Mastodynia: Secondary | ICD-10-CM | POA: Diagnosis not present

## 2022-05-20 DIAGNOSIS — M792 Neuralgia and neuritis, unspecified: Secondary | ICD-10-CM | POA: Diagnosis not present

## 2022-05-20 DIAGNOSIS — Z0000 Encounter for general adult medical examination without abnormal findings: Secondary | ICD-10-CM | POA: Diagnosis not present

## 2022-05-20 DIAGNOSIS — K581 Irritable bowel syndrome with constipation: Secondary | ICD-10-CM | POA: Diagnosis not present

## 2022-05-20 DIAGNOSIS — F339 Major depressive disorder, recurrent, unspecified: Secondary | ICD-10-CM | POA: Diagnosis not present

## 2022-05-21 ENCOUNTER — Other Ambulatory Visit: Payer: Self-pay | Admitting: Family Medicine

## 2022-05-21 DIAGNOSIS — N644 Mastodynia: Secondary | ICD-10-CM

## 2022-05-21 DIAGNOSIS — N63 Unspecified lump in unspecified breast: Secondary | ICD-10-CM

## 2022-05-22 ENCOUNTER — Other Ambulatory Visit: Payer: Self-pay | Admitting: Family Medicine

## 2022-05-22 ENCOUNTER — Ambulatory Visit
Admission: RE | Admit: 2022-05-22 | Discharge: 2022-05-22 | Disposition: A | Discharge status: Home / Self Care | Payer: Medicare HMO | Source: Ambulatory Visit | Attending: Family Medicine | Admitting: Family Medicine

## 2022-05-22 DIAGNOSIS — N63 Unspecified lump in unspecified breast: Secondary | ICD-10-CM | POA: Diagnosis not present

## 2022-05-22 DIAGNOSIS — N644 Mastodynia: Secondary | ICD-10-CM | POA: Insufficient documentation

## 2022-07-22 DIAGNOSIS — R319 Hematuria, unspecified: Secondary | ICD-10-CM | POA: Diagnosis not present

## 2022-08-23 DIAGNOSIS — R053 Chronic cough: Secondary | ICD-10-CM | POA: Diagnosis not present

## 2022-08-23 DIAGNOSIS — U099 Post covid-19 condition, unspecified: Secondary | ICD-10-CM | POA: Diagnosis not present

## 2022-08-23 DIAGNOSIS — Z87891 Personal history of nicotine dependence: Secondary | ICD-10-CM | POA: Diagnosis not present

## 2022-08-23 DIAGNOSIS — R059 Cough, unspecified: Secondary | ICD-10-CM | POA: Diagnosis not present

## 2022-08-23 DIAGNOSIS — L298 Other pruritus: Secondary | ICD-10-CM | POA: Diagnosis not present

## 2022-08-23 DIAGNOSIS — R5383 Other fatigue: Secondary | ICD-10-CM | POA: Diagnosis not present

## 2022-10-07 DIAGNOSIS — R058 Other specified cough: Secondary | ICD-10-CM | POA: Diagnosis not present

## 2022-10-30 DIAGNOSIS — H8110 Benign paroxysmal vertigo, unspecified ear: Secondary | ICD-10-CM | POA: Diagnosis not present

## 2022-11-20 DIAGNOSIS — R208 Other disturbances of skin sensation: Secondary | ICD-10-CM | POA: Diagnosis not present

## 2022-11-20 DIAGNOSIS — R202 Paresthesia of skin: Secondary | ICD-10-CM | POA: Diagnosis not present

## 2022-11-20 DIAGNOSIS — G479 Sleep disorder, unspecified: Secondary | ICD-10-CM | POA: Diagnosis not present

## 2022-11-20 DIAGNOSIS — T671XXA Heat syncope, initial encounter: Secondary | ICD-10-CM | POA: Diagnosis not present

## 2022-11-20 DIAGNOSIS — R200 Anesthesia of skin: Secondary | ICD-10-CM | POA: Diagnosis not present

## 2022-11-20 DIAGNOSIS — F4310 Post-traumatic stress disorder, unspecified: Secondary | ICD-10-CM | POA: Diagnosis not present

## 2022-11-20 DIAGNOSIS — R42 Dizziness and giddiness: Secondary | ICD-10-CM | POA: Diagnosis not present

## 2022-12-26 ENCOUNTER — Encounter: Payer: Self-pay | Admitting: Physician Assistant

## 2022-12-26 ENCOUNTER — Ambulatory Visit (INDEPENDENT_AMBULATORY_CARE_PROVIDER_SITE_OTHER): Payer: Medicare HMO | Admitting: Physician Assistant

## 2022-12-26 ENCOUNTER — Other Ambulatory Visit: Payer: Self-pay | Admitting: Physician Assistant

## 2022-12-26 VITALS — BP 130/90 | HR 89 | Temp 97.9°F | Resp 16 | Ht 66.0 in | Wt 230.0 lb

## 2022-12-26 DIAGNOSIS — R053 Chronic cough: Secondary | ICD-10-CM | POA: Diagnosis not present

## 2022-12-26 DIAGNOSIS — U099 Post covid-19 condition, unspecified: Secondary | ICD-10-CM | POA: Diagnosis not present

## 2022-12-26 DIAGNOSIS — K219 Gastro-esophageal reflux disease without esophagitis: Secondary | ICD-10-CM

## 2022-12-26 DIAGNOSIS — R0602 Shortness of breath: Secondary | ICD-10-CM

## 2022-12-26 MED ORDER — PREDNISONE 10 MG PO TABS: ORAL_TABLET | ORAL | 0 refills | Status: DC

## 2022-12-26 MED ORDER — OMEPRAZOLE 40 MG PO CPDR: 40.0000 mg | DELAYED_RELEASE_CAPSULE | Freq: Every day | ORAL | 1 refills | Status: DC

## 2022-12-26 NOTE — Progress Notes (Signed)
[facility] [place], [place] [address]  Pulmonary Sleep Medicine   Office Visit Note  Patient Name: Margaret Diaz DOB: Mar 19, 1966 MRN [id number]  Date of Service: 12/27/2022  Complaints/HPI: Pt is here to establish care with pulmonology. Her appointment was accidentally entered for New PCP, however she is here for pulmonary only and is established with Dr. Kary Kos as her PCP. She has had covid several times. Thinks she has had it 3 times, most recently 3 months ago. She has had a persistent cough since then and has some wheezing as well. Coughing leaves metallic taste in mouth, has not actually coughed up any blood or anything. She does get SOB some of the time. She denies any hx of asthma or COPD. She does have hiatal hernia as well, found several years ago and does have bad heartburn and abdominal pain with this. Did discuss that reflux and hernia may contribute to her chronic cough. She is only taking OTC strength omeprazole and will send script for her to try. She is going to discuss with her PCP further for next steps. Will go ahead and start pulmonary work up for further evaluation.   ROS  General: (-) fever, (-) chills, (-) night sweats, (-) weakness Skin: (-) rashes, (-) itching,. Eyes: (-) visual changes, (-) redness, (-) itching. Nose and Sinuses: (-) nasal stuffiness or itchiness, (-) postnasal drip, (-) nosebleeds, (-) sinus trouble. Mouth and Throat: (-) sore throat, (-) hoarseness. Neck: (-) swollen glands, (-) enlarged thyroid, (-) neck pain. Respiratory: + cough, (-) bloody sputum, + shortness of breath, + wheezing. Cardiovascular: - ankle swelling, (-) chest pain. Lymphatic: (-) lymph node enlargement. Neurologic: (-) numbness, (-) tingling. Psychiatric: (-) anxiety, (-) depression   Current Medication: Outpatient Encounter Medications as of 12/26/2022  Medication Sig   diazepam (VALIUM) 5 MG tablet Take 1 tablet by mouth in the morning and at  bedtime.   diphenhydrAMINE (BENADRYL) 25 MG tablet Take 25 mg by mouth every 6 (six) hours as needed.   DULoxetine (CYMBALTA) 60 MG capsule Take 60 mg by mouth daily.   linaclotide (LINZESS) 145 MCG CAPS capsule Take 1 capsule (145 mcg total) by mouth daily.   mirtazapine (REMERON) 30 MG tablet Take 0.5 tablets by mouth 1 day or 1 dose.   predniSONE (DELTASONE) 10 MG tablet Take one tab 3 x day for 3 days, then take one tab 2 x a day for 3 days and then take one tab a day for 3 days for copd   [DISCONTINUED] Adalimumab 40 MG/0.4ML PNKT Inject 0.4 mLs into the skin every 14 (fourteen) days.   [DISCONTINUED] Cholecalciferol (VITAMIN D3) 2000 units capsule Take 1 capsule (2,000 Units total) by mouth daily.   [DISCONTINUED] hydrOXYzine (ATARAX/VISTARIL) 25 MG tablet Take 25 mg by mouth 3 (three) times daily as needed.   [DISCONTINUED] Hyoscyamine Sulfate SL 0.125 MG SUBL Take 1-2 tablets by mouth every 4 (four) hours as needed.   [DISCONTINUED] omeprazole (PRILOSEC) 40 MG capsule Take 40 mg by mouth 2 (two) times daily.   omeprazole (PRILOSEC) 40 MG capsule Take 1 capsule (40 mg total) by mouth daily.   No facility-administered encounter medications on file as of 12/26/2022.    Surgical History: Past Surgical History:  Procedure Laterality Date   ABDOMINAL HYSTERECTOMY     AUGMENTATION MAMMAPLASTY Bilateral 1999   BREAST ENHANCEMENT SURGERY  1999    Medical History: Past Medical History:  Diagnosis Date   Arthritis    Dyspnea  05/10/2015   05/10/2015  Walked RA x 3 laps @ 185 ft each stopped due to  End of study, nl pace, no sob or desat  > dizzy at end     GERD (gastroesophageal reflux disease)    Hx of bilateral breast implants    Irritable bowel syndrome (IBS)    Pleural effusion on right 04/10/2015   Followed in Pulmonary clinic/ [facility]/ [facility]  - R thoracentesis 04/11/2015  550 cc > exudate with nl glucose ACUTE INFLAMMATION AND EOSINOPHILS. - ESR 05/10/2015 =30/ no significant  peripheral eos      Family History: Family History  Problem Relation Age of Onset   Mental illness Mother    Breast cancer Mother 20   Breast cancer Maternal Aunt 46   Breast cancer Maternal Aunt 50    Social History: Social History   Socioeconomic History   Marital status: Married    Spouse name: Not on file   Number of children: Not on file   Years of education: Not on file   Highest education level: Not on file  Occupational History   Not on file  Tobacco Use   Smoking status: Former    Packs/day: 0.50    Years: 6.00    Total pack years: 3.00    Types: Cigarettes    Quit date: 11/23/2011    Years since quitting: 11.1   Smokeless tobacco: Never  Substance and Sexual Activity   Alcohol use: No    Alcohol/week: 0.0 standard drinks of alcohol   Drug use: No   Sexual activity: Not on file  Other Topics Concern   Not on file  Social History Narrative   Not on file   Social Determinants of Health   Financial Resource Strain: Not on file  Food Insecurity: Not on file  Transportation Needs: Not on file  Physical Activity: Not on file  Stress: Not on file  Social Connections: Not on file  Intimate Partner Violence: Not on file    Vital Signs: Blood pressure (!) 130/90, pulse 89, temperature 97.9 F (36.6 C), resp. rate 16, height 5\' 6"  (1.676 m), weight 230 lb (104.3 kg), last menstrual period 06/29/1998, SpO2 95 %.  Examination: General Appearance: The patient is well-developed, well-nourished, and in no distress. Skin: Gross inspection of skin unremarkable. Head: normocephalic, no gross deformities. Eyes: no gross deformities noted. ENT: ears appear grossly normal no exudates. Neck: Supple. No thyromegaly. No LAD. Respiratory: Lungs clear to auscultation, patient with persistent cough throughout visit. Cardiovascular: Normal S1 and S2 without murmur or rub. Extremities: No cyanosis. pulses are equal. Neurologic: Alert and oriented. No involuntary  movements.  LABS: No results found for this or any previous visit (from the past 2160 hour(s)).  Radiology: US BREAST LTD UNI RIGHT INC AXILLA  Result Date: 05/22/2022 CLINICAL DATA:  Clinically appreciated palpable area in the RIGHT breast at 6 o'clock. Diffuse bilateral breast pain. EXAM: DIGITAL DIAGNOSTIC BILATERAL MAMMOGRAM WITH IMPLANTS, TOMOSYNTHESIS; ULTRASOUND RIGHT BREAST LIMITED TECHNIQUE: Bilateral digital diagnostic mammography and breast tomosynthesis was performed. Standard and/or implant displaced views were performed.; Targeted ultrasound examination of the right breast was performed COMPARISON:  Previous exam(s). ACR Breast Density Category b: There are scattered areas of fibroglandular density. FINDINGS: Diagnostic images were obtained of the site of palpable concern in the RIGHT lower breast. No suspicious mammographic findings are noted in this area. A questioned asymmetry in the RIGHT upper outer breast at middle depth effaces with additional views. No suspicious mammographic etiology  is identified for nonfocal bilateral breast pain. No suspicious mass, distortion, or microcalcifications are identified to suggest presence of malignancy. The patient has retropectoral saline implants. On physical exam, no suspicious mass is appreciated. Targeted ultrasound was performed of the site of clinical palpable concern in the RIGHT lower outer breast. No suspicious cystic or solid mass is seen. Targeted ultrasound was performed of the RIGHT upper outer breast at site of asymmetry concern. No suspicious cystic or solid mass is seen. There is a benign cluster of cysts noted at 11 o'clock 6 cm from the nipple which corresponds to the questioned asymmetry. IMPRESSION: 1. No mammographic or sonographic evidence of malignancy at the site of palpable concern in the RIGHT breast. No suspicious mammographic etiology for nonfocal bilateral breast pain. Any further workup of the patient's symptoms should be  based on the clinical assessment. Recommend routine annual screening mammogram in 1 year. 2. No mammographic evidence of malignancy bilaterally. RECOMMENDATION: Screening mammogram in one year.(Code:SM-B-01Y) I have discussed the findings and recommendations with the patient. If applicable, a reminder letter will be sent to the patient regarding the next appointment. BI-RADS CATEGORY  2: Benign. Electronically Signed   By: Valentino Saxon M.D.   On: 05/22/2022 14:56  MM DIAG BREAST W/IMPLANT TOMO BILATERAL  Result Date: 05/22/2022 CLINICAL DATA:  Clinically appreciated palpable area in the RIGHT breast at 6 o'clock. Diffuse bilateral breast pain. EXAM: DIGITAL DIAGNOSTIC BILATERAL MAMMOGRAM WITH IMPLANTS, TOMOSYNTHESIS; ULTRASOUND RIGHT BREAST LIMITED TECHNIQUE: Bilateral digital diagnostic mammography and breast tomosynthesis was performed. Standard and/or implant displaced views were performed.; Targeted ultrasound examination of the right breast was performed COMPARISON:  Previous exam(s). ACR Breast Density Category b: There are scattered areas of fibroglandular density. FINDINGS: Diagnostic images were obtained of the site of palpable concern in the RIGHT lower breast. No suspicious mammographic findings are noted in this area. A questioned asymmetry in the RIGHT upper outer breast at middle depth effaces with additional views. No suspicious mammographic etiology is identified for nonfocal bilateral breast pain. No suspicious mass, distortion, or microcalcifications are identified to suggest presence of malignancy. The patient has retropectoral saline implants. On physical exam, no suspicious mass is appreciated. Targeted ultrasound was performed of the site of clinical palpable concern in the RIGHT lower outer breast. No suspicious cystic or solid mass is seen. Targeted ultrasound was performed of the RIGHT upper outer breast at site of asymmetry concern. No suspicious cystic or solid mass is seen. There  is a benign cluster of cysts noted at 11 o'clock 6 cm from the nipple which corresponds to the questioned asymmetry. IMPRESSION: 1. No mammographic or sonographic evidence of malignancy at the site of palpable concern in the RIGHT breast. No suspicious mammographic etiology for nonfocal bilateral breast pain. Any further workup of the patient's symptoms should be based on the clinical assessment. Recommend routine annual screening mammogram in 1 year. 2. No mammographic evidence of malignancy bilaterally. RECOMMENDATION: Screening mammogram in one year.(Code:SM-B-01Y) I have discussed the findings and recommendations with the patient. If applicable, a reminder letter will be sent to the patient regarding the next appointment. BI-RADS CATEGORY  2: Benign. Electronically Signed   By: Valentino Saxon M.D.   On: 05/22/2022 14:56    No results found.  No results found.    Assessment and Plan: Patient Active Problem List   Diagnosis Date Noted   Syncope 11/30/2019   Palpitations 11/22/2019   Mitral valve prolapse 10/21/2019   Other dysphagia 06/19/2019   Hx of  adenomatous colonic polyps 06/10/2019   Bulging of cervical intervertebral disc 03/18/2017   Vitamin D insufficiency 01/01/2017   Chronic pain syndrome 10/01/2016   Chronic feet pain (Location of Primary Source of Pain) (Bilateral) (L>R) ([place]) 10/01/2016   Plantar fasciitis (Location of Primary Source of Pain) (Bilateral) (R>L) 12/07/2015   Chronic hip pain (Location of Secondary source of pain) (Bilateral) (L>R) 12/07/2015   Chronic knee pain (Location of Tertiary source of pain) (Bilateral) (L>R) 12/07/2015   Chronic neck pain (Right) 12/07/2015   Chronic low back pain (Bilateral) (L>R) 12/07/2015   Chronic hand pain (Bilateral) (R>L) 12/07/2015   Elevated sedimentation rate 12/07/2015   Long term current use of opiate analgesic 09/11/2015   Long term prescription opiate use 09/11/2015   Opiate use (30 MME/day) 09/11/2015    Opiate dependence ([place]) 09/11/2015   Encounter for therapeutic drug level monitoring 09/11/2015   Encounter for chronic pain management 09/11/2015   Opioid-induced constipation (OIC) 09/11/2015   Neurogenic pain 09/11/2015   Neuropathic pain 09/11/2015   Generalized pain 09/11/2015   Irritable bowel syndrome 09/11/2015   History of bilateral breast implants 09/11/2015   GERD (gastroesophageal reflux disease) 09/11/2015   Peptic ulcer disease 09/11/2015   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 Post-COVID chronic cough Will go ahead and treat with round of prednisone due to persistent coughing as well as start script for reflux to see if this helps. Will order CT chest and PFT for further evaluation - CT Chest Wo Contrast; Future - Pulmonary Function Test; Future - predniSONE (DELTASONE) 10 MG tablet; Take one tab 3 x day for 3 days, then take one tab 2 x a day for 3 days and then take one tab a day for 3 days for copd  Dispense: 18 tablet; Refill: 0  2. SOB (shortness of breath) - CT Chest Wo Contrast; Future - Pulmonary Function Test; Future - predniSONE (DELTASONE) 10 MG tablet; Take one tab 3 x day for 3 days, then take one tab 2 x a day for 3 days and then take one tab a day for 3 days for copd  Dispense: 18 tablet; Refill: 0  3. Gastroesophageal reflux disease, unspecified whether esophagitis present Pt reports hiatal hernia and will start omeprazole and follow up with PCP for re-evaluation of hernia as this may be impacting her coughing as well as worsening reflux. - omeprazole (PRILOSEC) 40 MG capsule; Take 1 capsule (40 mg total) by mouth daily.  Dispense: 30 capsule; Refill: 1   General Counseling: I have discussed the findings of the evaluation and examination with Azelea.  I have also discussed any further diagnostic evaluation thatmay be needed or ordered today. Raelynne verbalizes understanding of  the findings of todays visit. We also reviewed her medications today and discussed drug interactions and side effects including but not limited excessive drowsiness and altered mental states. We also discussed that there is always a risk not just to her but also people around her. she has been encouraged to call the office with any questions or concerns that should arise related to todays visit.  Orders Placed This Encounter  Procedures   CT Chest Wo Contrast    Standing Status:   Future    Standing Expiration Date:   12/26/2023    Order Specific Question:   Is patient pregnant?    Answer:   No    Order Specific Question:   Preferred imaging location?  Answer:   [facility]   Pulmonary Function Test    Standing Status:   Future    Standing Expiration Date:   12/26/2023    Order Specific Question:   Where should this test be performed?    Answer:   [facility]    Order Specific Question:   Full PFT: includes the following: basic spirometry, spirometry pre & post bronchodilator, diffusion capacity (DLCO), lung volumes    Answer:   Full PFT     Time spent: 82  I have personally obtained a history, examined the patient, evaluated laboratory and imaging results, formulated the assessment and plan and placed orders. This patient was seen by Drema Dallas, PA-C in collaboration with Dr. Devona Konig as a part of collaborative care agreement.     Allyne Gee, MD [facility] Pulmonary and Critical Care Sleep medicine

## 2022-12-26 NOTE — Progress Notes (Deleted)
Margaret Diaz, [place] [address]  Internal MEDICINE  Office Visit Note  Patient Name: Margaret Diaz  [id number]  FT:[id number]  Date of Service: 12/26/2022   Complaints/HPI Pt is here for establishment of PCP. Chief Complaint  Patient presents with   New Patient (Initial Visit)   Gastroesophageal Reflux   Quality Metric Gaps    TDAP, Papsmear, Colonoscopy   HPI Pt is here to establish care -She has had covid several times. Thinks she has had it 3 times, most recently 3 months ago. Has a persistent cough since then and has some wheezing as well. Coughing leaves metallic taste in mouth, has not actually coughed up any blood or anything. She does get SOB some of the time.    -She does have hiatal hernia as well, found several years ago and does have bad heartburn and abdominal pain with this.  -Does have neuropathy in her feet and joint pain. Does see neurology. Did start gaining more weight after meds adjusted. -Sleep eating, does take 1/2tab mirtazapine. Followed by neurology as well.  -Dr. Kary Kos prescribed valium and has helped with IBS   Current Medication: Outpatient Encounter Medications as of 12/26/2022  Medication Sig   diazepam (VALIUM) 5 MG tablet Take 1 tablet by mouth in the morning and at bedtime.   diphenhydrAMINE (BENADRYL) 25 MG tablet Take 25 mg by mouth every 6 (six) hours as needed.   DULoxetine (CYMBALTA) 60 MG capsule Take 60 mg by mouth daily.   linaclotide (LINZESS) 145 MCG CAPS capsule Take 1 capsule (145 mcg total) by mouth daily.   mirtazapine (REMERON) 30 MG tablet Take 0.5 tablets by mouth 1 day or 1 dose.   omeprazole (PRILOSEC) 40 MG capsule Take 40 mg by mouth 2 (two) times daily.   [DISCONTINUED] Adalimumab 40 MG/0.4ML PNKT Inject 0.4 mLs into the skin every 14 (fourteen) days.   [DISCONTINUED] Cholecalciferol (VITAMIN D3) 2000 units capsule Take 1 capsule (2,000 Units total) by mouth daily.   [DISCONTINUED]  hydrOXYzine (ATARAX/VISTARIL) 25 MG tablet Take 25 mg by mouth 3 (three) times daily as needed.   [DISCONTINUED] Hyoscyamine Sulfate SL 0.125 MG SUBL Take 1-2 tablets by mouth every 4 (four) hours as needed.   No facility-administered encounter medications on file as of 12/26/2022.    Surgical History: Past Surgical History:  Procedure Laterality Date   ABDOMINAL HYSTERECTOMY     AUGMENTATION MAMMAPLASTY Bilateral 1999   BREAST ENHANCEMENT SURGERY  1999    Medical History: Past Medical History:  Diagnosis Date   Arthritis    Dyspnea 05/10/2015   05/10/2015  Walked RA x 3 laps @ 185 ft each stopped due to  End of study, nl pace, no sob or desat  > dizzy at end     GERD (gastroesophageal reflux disease)    Hx of bilateral breast implants    Irritable bowel syndrome (IBS)    Pleural effusion on right 04/10/2015   Followed in Pulmonary clinic/ [facility]/ [facility]  - R thoracentesis 04/11/2015  550 cc > exudate with nl glucose ACUTE INFLAMMATION AND EOSINOPHILS. - ESR 05/10/2015 =30/ no significant peripheral eos      Family History: Family History  Problem Relation Age of Onset   Mental illness Mother    Breast cancer Mother 60   Breast cancer Maternal Aunt 56   Breast cancer Maternal Aunt 50    Social History   Socioeconomic History   Marital status: Married    Spouse  name: Not on file   Number of children: Not on file   Years of education: Not on file   Highest education level: Not on file  Occupational History   Not on file  Tobacco Use   Smoking status: Former    Packs/day: 0.50    Years: 6.00    Total pack years: 3.00    Types: Cigarettes    Quit date: 11/23/2011    Years since quitting: 11.0   Smokeless tobacco: Never  Substance and Sexual Activity   Alcohol use: No    Alcohol/week: 0.0 standard drinks of alcohol   Drug use: No   Sexual activity: Not on file  Other Topics Concern   Not on file  Social History Narrative   Not on file   Social Determinants  of Health   Financial Resource Strain: Not on file  Food Insecurity: Not on file  Transportation Needs: Not on file  Physical Activity: Not on file  Stress: Not on file  Social Connections: Not on file  Intimate Partner Violence: Not on file     Review of Systems  Vital Signs: BP (!) 130/90   Pulse 89   Temp 97.9 F (36.6 C)   Resp 16   Ht '5\' 6"'$  (1.676 m)   Wt 230 lb (104.3 kg)   LMP 06/29/1998   SpO2 95%   BMI 37.12 kg/m    Physical Exam    Assessment/Plan:   General Counseling: [name] verbalizes understanding of the findings of todays visit and agrees with plan of treatment. I have discussed any further diagnostic evaluation that may be needed or ordered today. We also reviewed her medications today. she has been encouraged to call the office with any questions or concerns that should arise related to todays visit.    Counseling:    No orders of the defined types were placed in this encounter.   No orders of the defined types were placed in this encounter.    This patient was seen by Drema Dallas, PA-C in collaboration with Dr. Clayborn Bigness as a part of collaborative care agreement.   Time spent:*** Minutes

## 2023-01-14 ENCOUNTER — Ambulatory Visit
Admission: RE | Admit: 2023-01-14 | Discharge: 2023-01-14 | Disposition: A | Discharge status: Home / Self Care | Payer: Medicare HMO | Source: Ambulatory Visit | Attending: Physician Assistant | Admitting: Physician Assistant

## 2023-01-14 DIAGNOSIS — R0602 Shortness of breath: Secondary | ICD-10-CM | POA: Insufficient documentation

## 2023-01-14 DIAGNOSIS — R053 Chronic cough: Secondary | ICD-10-CM | POA: Diagnosis not present

## 2023-01-14 DIAGNOSIS — U099 Post covid-19 condition, unspecified: Secondary | ICD-10-CM | POA: Diagnosis not present

## 2023-01-22 ENCOUNTER — Ambulatory Visit (INDEPENDENT_AMBULATORY_CARE_PROVIDER_SITE_OTHER): Payer: Medicare HMO | Admitting: Internal Medicine

## 2023-01-22 DIAGNOSIS — R053 Chronic cough: Secondary | ICD-10-CM

## 2023-01-22 DIAGNOSIS — U099 Post covid-19 condition, unspecified: Secondary | ICD-10-CM

## 2023-01-22 DIAGNOSIS — R0602 Shortness of breath: Secondary | ICD-10-CM

## 2023-02-06 ENCOUNTER — Encounter: Payer: Self-pay | Admitting: Internal Medicine

## 2023-02-06 ENCOUNTER — Ambulatory Visit: Payer: Medicare HMO | Admitting: Internal Medicine

## 2023-02-06 ENCOUNTER — Telehealth: Payer: Self-pay | Admitting: Internal Medicine

## 2023-02-06 ENCOUNTER — Telehealth: Payer: Self-pay

## 2023-02-06 VITALS — BP 140/82 | HR 83 | Temp 98.1°F | Resp 16 | Ht 66.0 in | Wt 229.2 lb

## 2023-02-06 DIAGNOSIS — J4489 Other specified chronic obstructive pulmonary disease: Secondary | ICD-10-CM

## 2023-02-06 DIAGNOSIS — U099 Post covid-19 condition, unspecified: Secondary | ICD-10-CM | POA: Diagnosis not present

## 2023-02-06 DIAGNOSIS — R053 Chronic cough: Secondary | ICD-10-CM

## 2023-02-06 MED ORDER — ALBUTEROL SULFATE HFA 108 (90 BASE) MCG/ACT IN AERS: 2.0000 | INHALATION_SPRAY | Freq: Four times a day (QID) | RESPIRATORY_TRACT | 0 refills | Status: DC | PRN

## 2023-02-06 MED ORDER — MOMETASONE FURO-FORMOTEROL FUM 100-5 MCG/ACT IN AERO: 2.0000 | INHALATION_SPRAY | Freq: Two times a day (BID) | RESPIRATORY_TRACT | 4 refills | Status: AC

## 2023-02-06 NOTE — Telephone Encounter (Signed)
error 

## 2023-02-06 NOTE — Telephone Encounter (Signed)
Notified patient of ugi & chest CT appointment dates, arrival times, location and npo after midnigt for ugi-[facility]

## 2023-02-06 NOTE — Patient Instructions (Signed)
Chronic Obstructive Pulmonary Disease  Chronic obstructive pulmonary disease (COPD) is a long-term (chronic) lung problem. When you have COPD, it is hard for air to get in and out of your lungs. Usually the condition gets worse over time, and your lungs will never return to normal. There are things you can do to keep yourself as healthy as possible. What are the causes? Smoking. This is the most common cause. Certain genes passed from parent to child (inherited). What increases the risk? Being exposed to secondhand smoke from cigarettes, pipes, or cigars. Being exposed to chemicals and other irritants, such as fumes and dust in the work environment. Having chronic lung conditions or infections. What are the signs or symptoms? Shortness of breath, especially during physical activity. A long-term cough with a large amount of thick mucus. Sometimes, the cough may not have any mucus (dry cough). Wheezing. Breathing quickly. Skin that looks gray or blue, especially in the fingers, toes, or lips. Feeling tired (fatigue). Weight loss. Chest tightness. Having infections often. Episodes when breathing symptoms become much worse (exacerbations). At the later stages of this disease, you may have swelling in the ankles, feet, or legs. How is this treated? Taking medicines. Quitting smoking, if you smoke. Rehabilitation. This includes steps to make your body work better. It may involve a team of specialists. Doing exercises. Making changes to your diet. Using oxygen. Lung surgery. Lung transplant. Comfort measures (palliative care). Follow these instructions at home: Medicines Take over-the-counter and prescription medicines only as told by your doctor. Talk to your doctor before taking any cough or allergy medicines. You may need to avoid medicines that cause your lungs to be dry. Lifestyle If you smoke, stop smoking. Smoking makes the problem worse. Do not smoke or use any products that  contain nicotine or tobacco. If you need help quitting, ask your doctor. Avoid being around things that make your breathing worse. This may include smoke, chemicals, and fumes. Stay active, but remember to rest as well. Learn and use tips on how to manage stress and control your breathing. Make sure you get enough sleep. Most adults need at least 7 hours of sleep every night. Eat healthy foods. Eat smaller meals more often. Rest before meals. Controlled breathing Learn and use tips on how to control your breathing as told by your doctor. Try: Breathing in (inhaling) through your nose for 1 second. Then, pucker your lips and breath out (exhale) through your lips for 2 seconds. Putting one hand on your belly (abdomen). Breathe in slowly through your nose for 1 second. Your hand on your belly should move out. Pucker your lips and breathe out slowly through your lips. Your hand on your belly should move in as you breathe out.  Controlled coughing Learn and use controlled coughing to clear mucus from your lungs. Follow these steps: Lean your head a little forward. Breathe in deeply. Try to hold your breath for 3 seconds. Keep your mouth slightly open while coughing 2 times. Spit any mucus out into a tissue. Rest and do the steps again 1 or 2 times as needed. General instructions Make sure you get all the shots (vaccines) that your doctor recommends. Ask your doctor about a flu shot and a pneumonia shot. Use oxygen therapy and pulmonary rehabilitation if told by your doctor. If you need home oxygen therapy, ask your doctor if you should buy a tool to measure your oxygen level (oximeter). Make a COPD action plan with your doctor. This helps you   to know what to do if you feel worse than usual. Manage any other conditions you have as told by your doctor. Avoid going outside when it is very hot, cold, or humid. Avoid people who have a sickness you can catch (contagious). Keep all follow-up  visits. Contact a doctor if: You cough up more mucus than usual. There is a change in the color or thickness of the mucus. It is harder to breathe than usual. Your breathing is faster than usual. You have trouble sleeping. You need to use your medicines more often than usual. You have trouble doing your normal activities such as getting dressed or walking around the house. Get help right away if: You have shortness of breath while resting. You have shortness of breath that stops you from: Being able to talk. Doing normal activities. Your chest hurts for longer than 5 minutes. Your skin color is more blue than usual. Your pulse oximeter shows that you have low oxygen for longer than 5 minutes. You have a fever. You feel too tired to breathe normally. These symptoms may represent a serious problem that is an emergency. Do not wait to see if the symptoms will go away. Get medical help right away. Call your local emergency services (911 in the [place] Do not drive yourself to the hospital. Summary Chronic obstructive pulmonary disease (COPD) is a long-term lung problem. The way your lungs work will never return to normal. Usually the condition gets worse over time. There are things you can do to keep yourself as healthy as possible. Take over-the-counter and prescription medicines only as told by your doctor. If you smoke, stop. Smoking makes the problem worse. This information is not intended to replace advice given to you by your health care provider. Make sure you discuss any questions you have with your health care provider. Document Revised: 08/15/2020 Document Reviewed: 08/15/2020 Elsevier Patient Education  2023 Elsevier Inc.  

## 2023-02-06 NOTE — Progress Notes (Signed)
[facility] [address] [place], [place] [address]  Pulmonary Sleep Medicine   Office Visit Note  Patient Name: Margaret Diaz DOB: 23-Nov-1965 MRN [id number]  Date of Service: 02/06/2023  Complaints/HPI: She states she has had covid several times. Last time she had covid was during christmas. She states the cough is about the same. She notes maybe less frequent. She states she quit smoking about 15 years ago. 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 Currently she has 2 dogs at home. She does not have other pets. She denies exposure to molds. She had a PFT done showsMILD Obstruction. She has no prior history of COPD. She is as noted a former smoker.  Office Spirometry Results:     ROS  General: (-) fever, (-) chills, (-) night sweats, (-) weakness Skin: (-) rashes, (-) itching,. Eyes: (-) visual changes, (-) redness, (-) itching. Nose and Sinuses: (-) nasal stuffiness or itchiness, (-) postnasal drip, (-) nosebleeds, (-) sinus trouble. Mouth and Throat: (-) sore throat, (-) hoarseness. Neck: (-) swollen glands, (-) enlarged thyroid, (-) neck pain. Respiratory: + cough, (-) bloody sputum, + shortness of breath, - wheezing. Cardiovascular: - ankle swelling, (-) chest pain. Lymphatic: (-) lymph node enlargement. Neurologic: (-) numbness, (-) tingling. Psychiatric: (-) anxiety, (-) depression   Current Medication: Outpatient Encounter Medications as of 02/06/2023  Medication Sig   diazepam (VALIUM) 5 MG tablet Take 1 tablet by mouth in the morning and at bedtime.   diphenhydrAMINE (BENADRYL) 25 MG tablet Take 25 mg by mouth every 6 (six) hours as needed.   DULoxetine (CYMBALTA) 60 MG capsule Take 60 mg by mouth daily.   linaclotide (LINZESS) 145 MCG CAPS capsule Take 1 capsule (145 mcg total) by mouth daily.   mirtazapine (REMERON) 30 MG tablet Take 0.5 tablets by mouth 1 day or 1 dose.   omeprazole (PRILOSEC) 40 MG capsule Take 1 capsule (40 mg total) by mouth daily.    predniSONE (DELTASONE) 10 MG tablet Take one tab 3 x day for 3 days, then take one tab 2 x a day for 3 days and then take one tab a day for 3 days for copd   No facility-administered encounter medications on file as of 02/06/2023.    Surgical History: Past Surgical History:  Procedure Laterality Date   ABDOMINAL HYSTERECTOMY     AUGMENTATION MAMMAPLASTY Bilateral 1999   BREAST ENHANCEMENT SURGERY  1999    Medical History: Past Medical History:  Diagnosis Date   Arthritis    Dyspnea 05/10/2015   05/10/2015  Walked RA x 3 laps @ 185 ft each stopped due to  End of study, nl pace, no sob or desat  > dizzy at end     GERD (gastroesophageal reflux disease)    Hx of bilateral breast implants    Irritable bowel syndrome (IBS)    Pleural effusion on right 04/10/2015   Followed in Pulmonary clinic/ [facility]/ [facility]  - R thoracentesis 04/11/2015  550 cc > exudate with nl glucose ACUTE INFLAMMATION AND EOSINOPHILS. - ESR 05/10/2015 =30/ no significant peripheral eos      Family History: Family History  Problem Relation Age of Onset   Mental illness Mother    Breast cancer Mother 41   Breast cancer Maternal Aunt 22   Breast cancer Maternal Aunt 15    Social History: Social History   Socioeconomic History   Marital status: Married    Spouse name: Not on file   Number of children:  Not on file   Years of education: Not on file   Highest education level: Not on file  Occupational History   Not on file  Tobacco Use   Smoking status: Former    Packs/day: 0.50    Years: 6.00    Additional pack years: 0.00    Total pack years: 3.00    Types: Cigarettes    Quit date: 11/23/2011    Years since quitting: 11.2   Smokeless tobacco: Never  Substance and Sexual Activity   Alcohol use: No    Alcohol/week: 0.0 standard drinks of alcohol   Drug use: No   Sexual activity: Not on file  Other Topics Concern   Not on file  Social History Narrative   Not on file   Social Determinants of  Health   Financial Resource Strain: Not on file  Food Insecurity: Not on file  Transportation Needs: Not on file  Physical Activity: Not on file  Stress: Not on file  Social Connections: Not on file  Intimate Partner Violence: Not on file    Vital Signs: Blood pressure (!) 140/82, pulse 83, temperature 98.1 F (36.7 C), resp. rate 16, height  (1.676 m), weight 229 lb 3.2 oz (104 kg), last menstrual period 06/29/1998, SpO2 94 %.  Examination: General Appearance: The patient is well-developed, well-nourished, and in no distress. Skin: Gross inspection of skin unremarkable. Head: normocephalic, no gross deformities. Eyes: no gross deformities noted. ENT: ears appear grossly normal no exudates. Neck: Supple. No thyromegaly. No LAD. Respiratory: no rhonchi noted. Cardiovascular: Normal S1 and S2 without murmur or rub. Extremities: No cyanosis. pulses are equal. Neurologic: Alert and oriented. No involuntary movements.  LABS: No results found for this or any previous visit (from the past 2160 hour(s)).  Radiology: CT Chest Wo Contrast  Result Date: 01/16/2023 CLINICAL DATA:  SOB EXAM: CT CHEST WITHOUT CONTRAST TECHNIQUE: Multidetector CT imaging of the chest was performed following the standard protocol without IV contrast. RADIATION DOSE REDUCTION: This exam was performed according to the departmental dose-optimization program which includes automated exposure control, adjustment of the mA and/or kV according to patient size and/or use of iterative reconstruction technique. COMPARISON:  03/31/2015 FINDINGS: Cardiovascular: No significant vascular findings. Normal heart size. No pericardial effusion. Mediastinum/Nodes: No enlarged mediastinal or axillary lymph nodes. Thyroid gland, trachea, and esophagus demonstrate no significant findings. Lungs/Pleura: Lingular and left lower lobe subsegmental atelectasis or scarring. Alveolar opacity left upper lobe consistent with small focus of  pneumonitis or atelectasis. Otherwise no evidence of pneumonia or pulmonary edema. No pneumothorax or pleural effusion. Upper Abdomen: No acute abnormality. Musculoskeletal: Bilateral breast prostheses. No chest wall mass or suspicious bone lesions identified. IMPRESSION: Left base subsegmental atelectasis or scarring. Minimal left upper lobe pneumonitis or atelectasis. Electronically Signed   By: Layla Maw M.D.   On: 01/16/2023 16:12    No results found.  CT Chest Wo Contrast  Result Date: 01/16/2023 CLINICAL DATA:  SOB EXAM: CT CHEST WITHOUT CONTRAST TECHNIQUE: Multidetector CT imaging of the chest was performed following the standard protocol without IV contrast. RADIATION DOSE REDUCTION: This exam was performed according to the departmental dose-optimization program which includes automated exposure control, adjustment of the mA and/or kV according to patient size and/or use of iterative reconstruction technique. COMPARISON:  03/31/2015 FINDINGS: Cardiovascular: No significant vascular findings. Normal heart size. No pericardial effusion. Mediastinum/Nodes: No enlarged mediastinal or axillary lymph nodes. Thyroid gland, trachea, and esophagus demonstrate no significant findings. Lungs/Pleura: Lingular and left lower lobe  subsegmental atelectasis or scarring. Alveolar opacity left upper lobe consistent with small focus of pneumonitis or atelectasis. Otherwise no evidence of pneumonia or pulmonary edema. No pneumothorax or pleural effusion. Upper Abdomen: No acute abnormality. Musculoskeletal: Bilateral breast prostheses. No chest wall mass or suspicious bone lesions identified. IMPRESSION: Left base subsegmental atelectasis or scarring. Minimal left upper lobe pneumonitis or atelectasis. Electronically Signed   By: Layla Maw M.D.   On: 01/16/2023 16:12    Assessment and Plan: Patient Active Problem List   Diagnosis Date Noted   Syncope 11/30/2019   Palpitations 11/22/2019   Mitral  valve prolapse 10/21/2019   Other dysphagia 06/19/2019   Hx of adenomatous colonic polyps 06/10/2019   Bulging of cervical intervertebral disc 03/18/2017   Vitamin D insufficiency 01/01/2017   Chronic pain syndrome 10/01/2016   Chronic feet pain (Location of Primary Source of Pain) (Bilateral) (L>R) (Burning) 10/01/2016   Plantar fasciitis (Location of Primary Source of Pain) (Bilateral) (R>L) 12/07/2015   Chronic hip pain (Location of Secondary source of pain) (Bilateral) (L>R) 12/07/2015   Chronic knee pain (Location of Tertiary source of pain) (Bilateral) (L>R) 12/07/2015   Chronic neck pain (Right) 12/07/2015   Chronic low back pain (Bilateral) (L>R) 12/07/2015   Chronic hand pain (Bilateral) (R>L) 12/07/2015   Elevated sedimentation rate 12/07/2015   Long term current use of opiate analgesic 09/11/2015   Long term prescription opiate use 09/11/2015   Opiate use (30 MME/day) 09/11/2015   Opiate dependence 09/11/2015   Encounter for therapeutic drug level monitoring 09/11/2015   Encounter for chronic pain management 09/11/2015   Opioid-induced constipation (OIC) 09/11/2015   Neurogenic pain 09/11/2015   Neuropathic pain 09/11/2015   Generalized pain 09/11/2015   Irritable bowel syndrome 09/11/2015   History of bilateral breast implants 09/11/2015   GERD (gastroesophageal reflux disease) 09/11/2015   Peptic ulcer disease 09/11/2015   Arthritis associated with inflammatory bowel disease 09/11/2015   Dysuria 05/10/2015   Osteoarthritis 03/09/2014    1. Obstructive chronic bronchitis without exacerbation Will place the patient on inhalers reviewed results CT scan in the past.  Will see how she does with the inhalers - albuterol (VENTOLIN HFA) 108 (90 Base) MCG/ACT inhaler; Inhale 2 puffs into the lungs every 6 (six) hours as needed for wheezing or shortness of breath.  Dispense: 8 g; Refill: 0 - mometasone-formoterol (DULERA) 100-5 MCG/ACT AERO; Inhale 2 puffs into the lungs 2  (two) times daily.  Dispense: 13 g; Refill: 4 - CT Chest High Resolution; Future  2. Post-COVID chronic cough I am going to order a follow-up CT scan of the chest as well as an upper GI to evaluate for aspiration. - CT Chest High Resolution; Future   General Counseling: I have discussed the findings of the evaluation and examination with Orion.  I have also discussed any further diagnostic evaluation thatmay be needed or ordered today. Tekia verbalizes understanding of the findings of todays visit. We also reviewed her medications today and discussed drug interactions and side effects including but not limited excessive drowsiness and altered mental states. We also discussed that there is always a risk not just to her but also people around her. she has been encouraged to call the office with any questions or concerns that should arise related to todays visit.  Orders Placed This Encounter  Procedures   CT Chest High Resolution    Standing Status:   Future    Number of Occurrences:   1    Standing Expiration Date:  02/06/2024    Order Specific Question:   Is patient pregnant?    Answer:   No    Order Specific Question:   Preferred imaging location?    Answer:   [facility]   DG UGI W SMALL BOWEL    Standing Status:   Future    Number of Occurrences:   1    Standing Expiration Date:   02/06/2024    Order Specific Question:   Reason for Exam (SYMPTOM  OR DIAGNOSIS REQUIRED)    Answer:   cough    Order Specific Question:   Is the patient pregnant?    Answer:   No    Order Specific Question:   Preferred Imaging Location?    Answer:   [facility]     Time spent: 73  I have personally obtained a history, examined the patient, evaluated laboratory and imaging results, formulated the assessment and plan and placed orders.    Yevonne Pax, MD [facility] Pulmonary and Critical Care Sleep medicine

## 2023-02-07 ENCOUNTER — Telehealth: Payer: Self-pay

## 2023-02-07 ENCOUNTER — Telehealth: Payer: Self-pay | Admitting: Internal Medicine

## 2023-02-07 NOTE — Telephone Encounter (Signed)
Completed P.A. for patient's Dulera.

## 2023-02-12 ENCOUNTER — Ambulatory Visit
Admission: RE | Admit: 2023-02-12 | Discharge: 2023-02-12 | Disposition: A | Discharge status: Home / Self Care | Payer: Medicare HMO | Source: Ambulatory Visit | Attending: Internal Medicine | Admitting: Internal Medicine

## 2023-02-12 DIAGNOSIS — R053 Chronic cough: Secondary | ICD-10-CM | POA: Diagnosis not present

## 2023-02-12 DIAGNOSIS — K222 Esophageal obstruction: Secondary | ICD-10-CM | POA: Diagnosis not present

## 2023-02-14 ENCOUNTER — Ambulatory Visit: Admission: RE | Admit: 2023-02-14 | Payer: Medicare HMO | Source: Ambulatory Visit

## 2023-02-21 ENCOUNTER — Ambulatory Visit
Admission: RE | Admit: 2023-02-21 | Discharge: 2023-02-21 | Disposition: A | Discharge status: Home / Self Care | Payer: Medicare HMO | Source: Ambulatory Visit | Attending: Internal Medicine | Admitting: Internal Medicine

## 2023-02-21 DIAGNOSIS — R053 Chronic cough: Secondary | ICD-10-CM | POA: Diagnosis not present

## 2023-02-21 DIAGNOSIS — J4489 Other specified chronic obstructive pulmonary disease: Secondary | ICD-10-CM | POA: Insufficient documentation

## 2023-02-21 DIAGNOSIS — U099 Post covid-19 condition, unspecified: Secondary | ICD-10-CM | POA: Diagnosis not present

## 2023-02-25 NOTE — Procedures (Signed)
[facility] [address] [place], [address]    Complete Pulmonary Function Testing Interpretation:  FINDINGS:  Portable capacity is mildly decreased.  FEV1 is 2.06 L which is 72% of predicted and is mildly decreased.  F1 FVC ratio is mildly decreased.  Postbronchodilator there is no significant change in the FEV1 however clinical improvement may occur.  Total lung capacity is normal.  Residual volume is normal.  Residual volume total lung capacity ratio is increased.  FRC is normal.  The DLCO was normal corrected for alveolar volume  IMPRESSION:  This pulmonary function study is within normal limits clinical correlation is recommended.  Yevonne Pax, MD [facility] Pulmonary Critical Care Medicine Sleep Medicine

## 2023-03-06 ENCOUNTER — Other Ambulatory Visit: Payer: Self-pay | Admitting: Physician Assistant

## 2023-03-06 DIAGNOSIS — K219 Gastro-esophageal reflux disease without esophagitis: Secondary | ICD-10-CM

## 2023-03-27 DIAGNOSIS — L578 Other skin changes due to chronic exposure to nonionizing radiation: Secondary | ICD-10-CM | POA: Diagnosis not present

## 2023-03-27 DIAGNOSIS — L821 Other seborrheic keratosis: Secondary | ICD-10-CM | POA: Diagnosis not present

## 2023-03-27 DIAGNOSIS — L538 Other specified erythematous conditions: Secondary | ICD-10-CM | POA: Diagnosis not present

## 2023-03-27 DIAGNOSIS — L820 Inflamed seborrheic keratosis: Secondary | ICD-10-CM | POA: Diagnosis not present

## 2023-04-21 LAB — PULMONARY FUNCTION TEST

## 2023-05-05 DIAGNOSIS — K449 Diaphragmatic hernia without obstruction or gangrene: Secondary | ICD-10-CM | POA: Diagnosis not present

## 2023-05-05 DIAGNOSIS — K219 Gastro-esophageal reflux disease without esophagitis: Secondary | ICD-10-CM | POA: Diagnosis not present

## 2023-05-05 DIAGNOSIS — R197 Diarrhea, unspecified: Secondary | ICD-10-CM | POA: Diagnosis not present

## 2023-05-05 DIAGNOSIS — R112 Nausea with vomiting, unspecified: Secondary | ICD-10-CM | POA: Diagnosis not present

## 2023-05-07 ENCOUNTER — Other Ambulatory Visit: Payer: Self-pay | Admitting: Physician Assistant

## 2023-05-07 DIAGNOSIS — K219 Gastro-esophageal reflux disease without esophagitis: Secondary | ICD-10-CM

## 2023-08-06 ENCOUNTER — Other Ambulatory Visit: Payer: Self-pay | Admitting: Physician Assistant

## 2023-08-06 DIAGNOSIS — K219 Gastro-esophageal reflux disease without esophagitis: Secondary | ICD-10-CM

## 2023-08-07 ENCOUNTER — Encounter: Payer: Self-pay | Admitting: Physician Assistant

## 2023-08-07 ENCOUNTER — Telehealth: Payer: Medicare HMO | Admitting: Physician Assistant

## 2023-08-07 VITALS — Resp 16 | Ht 66.0 in

## 2023-08-07 DIAGNOSIS — U099 Post covid-19 condition, unspecified: Secondary | ICD-10-CM | POA: Diagnosis not present

## 2023-08-07 DIAGNOSIS — J4489 Other specified chronic obstructive pulmonary disease: Secondary | ICD-10-CM

## 2023-08-07 DIAGNOSIS — R053 Chronic cough: Secondary | ICD-10-CM

## 2023-08-07 NOTE — Progress Notes (Signed)
[facility] [address] [place], [place] [address]  Internal MEDICINE  Telephone Visit  Patient Name: Margaret Diaz  [id number]  [id number]  Date of Service: 08/19/2023  I connected with the patient at 11:54 by telephone and verified the patients identity using two identifiers.   I discussed the limitations, risks, security and privacy concerns of performing an evaluation and management service by telephone and the availability of in person appointments. I also discussed with the patient that there may be a patient responsible charge related to the service.  The patient expressed understanding and agrees to proceed.    Chief Complaint  Patient presents with   Telephone Assessment    Patient prefers telephone call: [phone]   Telephone Screen   Follow-up    PFT F/U    HPI Pt is here for virtual pulmonary follow up -Breathing has been about the same. Using dulera inhaler daily and this has been helping. Does not need albuterol -PFT previously reviewed, but did have updated CT chest a few months ago due to post covid chronic cough. Pt still has some coughing, may be better but then will catch something, like she has a cold now so is coughing some intermittently. -some aortic atherosclerosis and 2 vessel coronary disease seen on CT chest. Discussed findings and pt will discuss further with PCP and maybe consider cardiology evaluation. May need statin.  Current Medication: Outpatient Encounter Medications as of 08/07/2023  Medication Sig   albuterol (VENTOLIN HFA) 108 (90 Base) MCG/ACT inhaler Inhale 2 puffs into the lungs every 6 (six) hours as needed for wheezing or shortness of breath.   diazepam (VALIUM) 5 MG tablet Take 1 tablet by mouth in the morning and at bedtime.   diphenhydrAMINE (BENADRYL) 25 MG tablet Take 25 mg by mouth every 6 (six) hours as needed.   DULoxetine (CYMBALTA) 60 MG capsule Take 60 mg by mouth daily.   linaclotide (LINZESS) 145 MCG CAPS  capsule Take 1 capsule (145 mcg total) by mouth daily.   mirtazapine (REMERON) 30 MG tablet Take 0.5 tablets by mouth 1 day or 1 dose.   mometasone-formoterol (DULERA) 100-5 MCG/ACT AERO Inhale 2 puffs into the lungs 2 (two) times daily.   omeprazole (PRILOSEC) 40 MG capsule TAKE 1 CAPSULE(40 MG) BY MOUTH DAILY   predniSONE (DELTASONE) 10 MG tablet Take one tab 3 x day for 3 days, then take one tab 2 x a day for 3 days and then take one tab a day for 3 days for copd   No facility-administered encounter medications on file as of 08/07/2023.    Surgical History: Past Surgical History:  Procedure Laterality Date   ABDOMINAL HYSTERECTOMY     AUGMENTATION MAMMAPLASTY Bilateral 1999   BREAST ENHANCEMENT SURGERY  1999    Medical History: Past Medical History:  Diagnosis Date   Arthritis    Dyspnea 05/10/2015   05/10/2015  Walked RA x 3 laps @ 185 ft each stopped due to  End of study, nl pace, no sob or desat  > dizzy at end     GERD (gastroesophageal reflux disease)    Hx of bilateral breast implants    Irritable bowel syndrome (IBS)    Pleural effusion on right 04/10/2015   Followed in Pulmonary clinic/ [facility]/ [facility]  - R thoracentesis 04/11/2015  550 cc > exudate with nl glucose ACUTE INFLAMMATION AND EOSINOPHILS. - ESR 05/10/2015 =30/ no significant peripheral eos      Family History: Family History  Problem Relation Age of Onset   Mental illness Mother    Breast cancer Mother 7   Breast cancer Maternal Aunt 92   Breast cancer Maternal Aunt 44    Social History   Socioeconomic History   Marital status: Married    Spouse name: Not on file   Number of children: Not on file   Years of education: Not on file   Highest education level: Not on file  Occupational History   Not on file  Tobacco Use   Smoking status: Former    Current packs/day: 0.00    Average packs/day: 0.5 packs/day for 6.0 years (3.0 ttl pk-yrs)    Types: Cigarettes    Start date: 11/22/2005    Quit  date: 11/23/2011    Years since quitting: 11.7   Smokeless tobacco: Never  Substance and Sexual Activity   Alcohol use: No    Alcohol/week: 0.0 standard drinks of alcohol   Drug use: No   Sexual activity: Not on file  Other Topics Concern   Not on file  Social History Narrative   Not on file   Social Determinants of Health   Financial Resource Strain: Not on file  Food Insecurity: Not on file  Transportation Needs: Not on file  Physical Activity: Not on file  Stress: Not on file  Social Connections: Not on file  Intimate Partner Violence: Not on file      Review of Systems  Constitutional:  Negative for chills, fatigue and unexpected weight change.  HENT:  Positive for congestion and postnasal drip. Negative for rhinorrhea.   Eyes:  Negative for redness.  Respiratory:  Positive for cough. Negative for chest tightness and shortness of breath.   Cardiovascular:  Negative for chest pain and palpitations.  Neurological: Negative.   Hematological:  Negative for adenopathy. Does not bruise/bleed easily.  Psychiatric/Behavioral:  Negative for sleep disturbance. Behavioral problem: Depression.    Vital Signs: Resp 16   Ht 5\' 6"  (1.676 m)   LMP 06/29/1998   BMI 36.99 kg/m    Observation/Objective:  Pt is able to carry out conversation  Assessment/Plan: 1. Post-COVID chronic cough Will plan for 1 year repeat PFT and CT - CT Chest Wo Contrast; Future - Pulmonary Function Test; Future  2. Obstructive chronic bronchitis without exacerbation (HCC) Continue inhalers as before - CT Chest Wo Contrast; Future - Pulmonary Function Test; Future   General Counseling: Margaret Diaz verbalizes understanding of the findings of today's phone visit and agrees with plan of treatment. I have discussed any further diagnostic evaluation that may be needed or ordered today. We also reviewed her medications today. she has been encouraged to call the office with any questions or concerns that  should arise related to todays visit.    Orders Placed This Encounter  Procedures   CT Chest Wo Contrast   Pulmonary Function Test    No orders of the defined types were placed in this encounter.   Time spent:30 Minutes    Dr Lyndon Code Internal medicine

## 2023-08-11 DIAGNOSIS — I2510 Atherosclerotic heart disease of native coronary artery without angina pectoris: Secondary | ICD-10-CM | POA: Diagnosis not present

## 2023-08-14 DIAGNOSIS — R200 Anesthesia of skin: Secondary | ICD-10-CM | POA: Diagnosis not present

## 2023-08-14 DIAGNOSIS — F4310 Post-traumatic stress disorder, unspecified: Secondary | ICD-10-CM | POA: Diagnosis not present

## 2023-08-14 DIAGNOSIS — R202 Paresthesia of skin: Secondary | ICD-10-CM | POA: Diagnosis not present

## 2023-08-14 DIAGNOSIS — R42 Dizziness and giddiness: Secondary | ICD-10-CM | POA: Diagnosis not present

## 2023-08-14 DIAGNOSIS — G479 Sleep disorder, unspecified: Secondary | ICD-10-CM | POA: Diagnosis not present

## 2023-08-14 DIAGNOSIS — R208 Other disturbances of skin sensation: Secondary | ICD-10-CM | POA: Diagnosis not present

## 2023-08-14 DIAGNOSIS — M792 Neuralgia and neuritis, unspecified: Secondary | ICD-10-CM | POA: Diagnosis not present

## 2023-08-14 DIAGNOSIS — T671XXA Heat syncope, initial encounter: Secondary | ICD-10-CM | POA: Diagnosis not present

## 2023-09-03 DIAGNOSIS — I2510 Atherosclerotic heart disease of native coronary artery without angina pectoris: Secondary | ICD-10-CM | POA: Diagnosis not present

## 2023-09-11 DIAGNOSIS — Z7689 Persons encountering health services in other specified circumstances: Secondary | ICD-10-CM | POA: Diagnosis not present

## 2023-09-11 DIAGNOSIS — I2510 Atherosclerotic heart disease of native coronary artery without angina pectoris: Secondary | ICD-10-CM | POA: Diagnosis not present

## 2023-09-11 DIAGNOSIS — R0789 Other chest pain: Secondary | ICD-10-CM | POA: Diagnosis not present

## 2023-09-17 DIAGNOSIS — R198 Other specified symptoms and signs involving the digestive system and abdomen: Secondary | ICD-10-CM | POA: Diagnosis not present

## 2023-09-17 DIAGNOSIS — Z0000 Encounter for general adult medical examination without abnormal findings: Secondary | ICD-10-CM | POA: Diagnosis not present

## 2023-09-17 DIAGNOSIS — R42 Dizziness and giddiness: Secondary | ICD-10-CM | POA: Diagnosis not present

## 2023-09-17 DIAGNOSIS — K5909 Other constipation: Secondary | ICD-10-CM | POA: Diagnosis not present

## 2023-09-17 DIAGNOSIS — F339 Major depressive disorder, recurrent, unspecified: Secondary | ICD-10-CM | POA: Diagnosis not present

## 2023-09-17 DIAGNOSIS — Z1331 Encounter for screening for depression: Secondary | ICD-10-CM | POA: Diagnosis not present

## 2023-09-26 ENCOUNTER — Other Ambulatory Visit: Payer: Self-pay | Admitting: Family Medicine

## 2023-09-26 DIAGNOSIS — Z1231 Encounter for screening mammogram for malignant neoplasm of breast: Secondary | ICD-10-CM

## 2023-10-06 DIAGNOSIS — K5989 Other specified functional intestinal disorders: Secondary | ICD-10-CM | POA: Diagnosis not present

## 2023-10-06 DIAGNOSIS — R198 Other specified symptoms and signs involving the digestive system and abdomen: Secondary | ICD-10-CM | POA: Diagnosis not present

## 2023-10-09 DIAGNOSIS — F4310 Post-traumatic stress disorder, unspecified: Secondary | ICD-10-CM | POA: Diagnosis not present

## 2023-10-09 DIAGNOSIS — R6889 Other general symptoms and signs: Secondary | ICD-10-CM | POA: Diagnosis not present

## 2023-10-09 DIAGNOSIS — R55 Syncope and collapse: Secondary | ICD-10-CM | POA: Diagnosis not present

## 2023-10-09 DIAGNOSIS — R42 Dizziness and giddiness: Secondary | ICD-10-CM | POA: Diagnosis not present

## 2023-10-09 DIAGNOSIS — G629 Polyneuropathy, unspecified: Secondary | ICD-10-CM | POA: Diagnosis not present

## 2023-10-09 DIAGNOSIS — G479 Sleep disorder, unspecified: Secondary | ICD-10-CM | POA: Diagnosis not present

## 2023-10-09 DIAGNOSIS — R208 Other disturbances of skin sensation: Secondary | ICD-10-CM | POA: Diagnosis not present

## 2023-11-07 ENCOUNTER — Other Ambulatory Visit: Payer: Self-pay | Admitting: Physician Assistant

## 2023-11-07 DIAGNOSIS — K219 Gastro-esophageal reflux disease without esophagitis: Secondary | ICD-10-CM

## 2023-12-02 ENCOUNTER — Other Ambulatory Visit: Payer: Self-pay | Admitting: Internal Medicine

## 2023-12-02 DIAGNOSIS — J4489 Other specified chronic obstructive pulmonary disease: Secondary | ICD-10-CM

## 2023-12-12 ENCOUNTER — Other Ambulatory Visit: Payer: Self-pay

## 2023-12-15 ENCOUNTER — Ambulatory Visit: Payer: Medicare HMO | Admitting: Gastroenterology

## 2024-01-20 DIAGNOSIS — D2272 Melanocytic nevi of left lower limb, including hip: Secondary | ICD-10-CM | POA: Diagnosis not present

## 2024-01-20 DIAGNOSIS — L570 Actinic keratosis: Secondary | ICD-10-CM | POA: Diagnosis not present

## 2024-01-20 DIAGNOSIS — L820 Inflamed seborrheic keratosis: Secondary | ICD-10-CM | POA: Diagnosis not present

## 2024-01-20 DIAGNOSIS — L821 Other seborrheic keratosis: Secondary | ICD-10-CM | POA: Diagnosis not present

## 2024-01-20 DIAGNOSIS — D225 Melanocytic nevi of trunk: Secondary | ICD-10-CM | POA: Diagnosis not present

## 2024-01-20 DIAGNOSIS — D2261 Melanocytic nevi of right upper limb, including shoulder: Secondary | ICD-10-CM | POA: Diagnosis not present

## 2024-01-20 DIAGNOSIS — D2262 Melanocytic nevi of left upper limb, including shoulder: Secondary | ICD-10-CM | POA: Diagnosis not present

## 2024-01-20 DIAGNOSIS — D485 Neoplasm of uncertain behavior of skin: Secondary | ICD-10-CM | POA: Diagnosis not present

## 2024-01-20 DIAGNOSIS — D2271 Melanocytic nevi of right lower limb, including hip: Secondary | ICD-10-CM | POA: Diagnosis not present

## 2024-03-12 ENCOUNTER — Telehealth: Payer: Self-pay | Admitting: Internal Medicine

## 2024-03-12 NOTE — Telephone Encounter (Signed)
 Lvm notifying patient of chest CT appointment date, arrival time, location-Toni

## 2024-03-17 ENCOUNTER — Ambulatory Visit
Admission: RE | Admit: 2024-03-17 | Discharge: 2024-03-17 | Disposition: A | Discharge status: Home / Self Care | Source: Ambulatory Visit | Attending: Physician Assistant | Admitting: Physician Assistant

## 2024-03-17 ENCOUNTER — Ambulatory Visit: Admission: RE | Admit: 2024-03-17 | Source: Ambulatory Visit

## 2024-03-17 DIAGNOSIS — J4489 Other specified chronic obstructive pulmonary disease: Secondary | ICD-10-CM | POA: Diagnosis not present

## 2024-03-17 DIAGNOSIS — U099 Post covid-19 condition, unspecified: Secondary | ICD-10-CM | POA: Diagnosis not present

## 2024-03-17 DIAGNOSIS — I700 Atherosclerosis of aorta: Secondary | ICD-10-CM | POA: Diagnosis not present

## 2024-03-17 DIAGNOSIS — R053 Chronic cough: Secondary | ICD-10-CM | POA: Insufficient documentation

## 2024-04-08 DIAGNOSIS — I341 Nonrheumatic mitral (valve) prolapse: Secondary | ICD-10-CM | POA: Diagnosis not present

## 2024-04-08 DIAGNOSIS — R0789 Other chest pain: Secondary | ICD-10-CM | POA: Diagnosis not present

## 2024-04-08 DIAGNOSIS — E782 Mixed hyperlipidemia: Secondary | ICD-10-CM | POA: Diagnosis not present

## 2024-04-08 DIAGNOSIS — I25118 Atherosclerotic heart disease of native coronary artery with other forms of angina pectoris: Secondary | ICD-10-CM | POA: Diagnosis not present

## 2024-04-15 ENCOUNTER — Other Ambulatory Visit: Payer: Self-pay | Admitting: Student

## 2024-04-15 DIAGNOSIS — R0789 Other chest pain: Secondary | ICD-10-CM

## 2024-04-15 DIAGNOSIS — I2510 Atherosclerotic heart disease of native coronary artery without angina pectoris: Secondary | ICD-10-CM

## 2024-04-19 ENCOUNTER — Other Ambulatory Visit: Payer: Self-pay | Admitting: Internal Medicine

## 2024-04-28 ENCOUNTER — Ambulatory Visit: Payer: Medicare HMO | Admitting: Internal Medicine

## 2024-04-28 DIAGNOSIS — U099 Post covid-19 condition, unspecified: Secondary | ICD-10-CM | POA: Diagnosis not present

## 2024-04-28 DIAGNOSIS — J4489 Other specified chronic obstructive pulmonary disease: Secondary | ICD-10-CM

## 2024-04-28 DIAGNOSIS — R053 Chronic cough: Secondary | ICD-10-CM

## 2024-05-02 NOTE — Procedures (Signed)
 [facility] [address] [place], [address]    Complete Pulmonary Function Testing Interpretation:  FINDINGS:  Forced vital capacity is normal.  FEV1 is normal.  FEV1 FVC ratio was mildly decreased.  Postbronchodilator there is no significant change in the FEV1.  Total lung capacity is normal.  Residual volume is normal.  FRC is normal.  DLCO is mildly decreased.  IMPRESSION:  This pulmonary function study is consistent with normal spirometry and lung volumes.  Patient has mild reduction in the DLCO noted clinical correlation is recommended  Margaret DELENA Bathe, MD [facility] Pulmonary Critical Care Medicine Sleep Medicine

## 2024-05-05 DIAGNOSIS — R42 Dizziness and giddiness: Secondary | ICD-10-CM | POA: Diagnosis not present

## 2024-05-05 DIAGNOSIS — G479 Sleep disorder, unspecified: Secondary | ICD-10-CM | POA: Diagnosis not present

## 2024-05-05 DIAGNOSIS — G629 Polyneuropathy, unspecified: Secondary | ICD-10-CM | POA: Diagnosis not present

## 2024-05-10 ENCOUNTER — Ambulatory Visit: Payer: Medicare HMO | Admitting: Internal Medicine

## 2024-05-10 ENCOUNTER — Telehealth: Payer: Self-pay | Admitting: Internal Medicine

## 2024-05-10 ENCOUNTER — Encounter: Payer: Self-pay | Admitting: Internal Medicine

## 2024-05-10 VITALS — BP 110/80 | HR 82 | Temp 98.2°F | Resp 16 | Ht 66.0 in | Wt 198.0 lb

## 2024-05-10 DIAGNOSIS — K219 Gastro-esophageal reflux disease without esophagitis: Secondary | ICD-10-CM

## 2024-05-10 DIAGNOSIS — G4719 Other hypersomnia: Secondary | ICD-10-CM | POA: Diagnosis not present

## 2024-05-10 NOTE — Telephone Encounter (Signed)
 Awaiting 05/10/24 office notes for SS order-Toni

## 2024-05-10 NOTE — Patient Instructions (Signed)

## 2024-05-10 NOTE — Progress Notes (Unsigned)
 [facility] [address] [place], [place] [address]  Pulmonary Sleep Medicine   Office Visit Note  Patient Name: Margaret Diaz DOB: May 25, 1966 MRN [id number]  Date of Service: 05/10/2024  Complaints/HPI: She is doing well. She had PFT done which is normal an improvement from 2024. DLCO was mild reduction not smoking. She states she is tired a lot. She states no energy noted. She does snore. She states she will be sleepy when she sits in front of the TV. She does feel sleepy when she is driving. She goes to bed tired and wakes up tired  Office Spirometry Results:     ROS  General: (-) fever, (-) chills, (-) night sweats, (-) weakness Skin: (-) rashes, (-) itching,. Eyes: (-) visual changes, (-) redness, (-) itching. Nose and Sinuses: (-) nasal stuffiness or itchiness, (-) postnasal drip, (-) nosebleeds, (-) sinus trouble. Mouth and Throat: (-) sore throat, (-) hoarseness. Neck: (-) swollen glands, (-) enlarged thyroid , (-) neck pain. Respiratory: - cough, (-) bloody sputum, - shortness of breath, - wheezing. Cardiovascular: - ankle swelling, (-) chest pain. Lymphatic: (-) lymph node enlargement. Neurologic: (-) numbness, (-) tingling. Psychiatric: (-) anxiety, (-) depression   Current Medication: Outpatient Encounter Medications as of 05/10/2024  Medication Sig   albuterol  (VENTOLIN  HFA) 108 (90 Base) MCG/ACT inhaler INHALE 2 PUFFS INTO THE LUNGS EVERY 6 HOURS AS NEEDED FOR WHEEZING OR SHORTNESS OF BREATH   diazepam (VALIUM) 5 MG tablet Take 1 tablet by mouth in the morning and at bedtime.   diphenhydrAMINE (BENADRYL) 25 MG tablet Take 25 mg by mouth every 6 (six) hours as needed.   DULoxetine (CYMBALTA) 30 MG capsule Take 30 mg by mouth daily.   gabapentin  (NEURONTIN ) 600 MG tablet Take 600 mg by mouth 2 (two) times daily.   ibuprofen (ADVIL) 200 MG tablet Take 200 mg by mouth every 8 (eight) hours as needed for mild pain (pain score 1-3).   linaclotide   (LINZESS ) 145 MCG CAPS capsule Take 1 capsule (145 mcg total) by mouth daily.   meclizine (ANTIVERT) 25 MG tablet Take 25 mg by mouth 2 (two) times daily as needed for dizziness.   mirtazapine (REMERON) 30 MG tablet Take 0.5 tablets by mouth 1 day or 1 dose.   mometasone -formoterol  (DULERA) 100-5 MCG/ACT AERO Inhale 2 puffs into the lungs 2 (two) times daily.   nortriptyline (PAMELOR) 50 MG capsule Take 50 mg by mouth at bedtime.   omeprazole  (PRILOSEC) 40 MG capsule TAKE 1 CAPSULE(40 MG) BY MOUTH DAILY   ondansetron  (ZOFRAN ) 4 MG tablet Take 4 mg by mouth every 8 (eight) hours as needed.   [DISCONTINUED] predniSONE  (DELTASONE ) 10 MG tablet Take one tab 3 x day for 3 days, then take one tab 2 x a day for 3 days and then take one tab a day for 3 days for copd   No facility-administered encounter medications on file as of 05/10/2024.    Surgical History: Past Surgical History:  Procedure Laterality Date   ABDOMINAL HYSTERECTOMY     AUGMENTATION MAMMAPLASTY Bilateral 1999   BREAST ENHANCEMENT SURGERY  1999    Medical History: Past Medical History:  Diagnosis Date   Arthritis    Dyspnea 05/10/2015   05/10/2015  Walked RA x 3 laps @ 185 ft each stopped due to  End of study, nl pace, no sob or desat  > dizzy at end     GERD (gastroesophageal reflux disease)    Hx of bilateral breast implants  Irritable bowel syndrome (IBS)    Pleural effusion on right 04/10/2015   Followed in Pulmonary clinic/ [facility]/ [facility]  - R thoracentesis 04/11/2015  550 cc > exudate with nl glucose ACUTE INFLAMMATION AND EOSINOPHILS. - ESR 05/10/2015 =30/ no significant peripheral eos      Family History: Family History  Problem Relation Age of Onset   Mental illness Mother    Breast cancer Mother 83   Breast cancer Maternal Aunt 52   Breast cancer Maternal Aunt 68    Social History: Social History   Socioeconomic History   Marital status: Married    Spouse name: Not on file   Number of children:  Not on file   Years of education: Not on file   Highest education level: Not on file  Occupational History   Not on file  Tobacco Use   Smoking status: Former    Current packs/day: 0.00    Average packs/day: 0.5 packs/day for 6.0 years (3.0 ttl pk-yrs)    Types: Cigarettes    Start date: 11/22/2005    Quit date: 11/23/2011    Years since quitting: 12.4   Smokeless tobacco: Never  Substance and Sexual Activity   Alcohol use: No    Alcohol/week: 0.0 standard drinks of alcohol   Drug use: No   Sexual activity: Not on file  Other Topics Concern   Not on file  Social History Narrative   Not on file   Social Drivers of Health   Financial Resource Strain: Patient Declined (09/17/2023)   Received from [facility]   Overall Financial Resource Strain (CARDIA)    Difficulty of Paying Living Expenses: Patient declined  Food Insecurity: Patient Declined (09/17/2023)   Received from [facility]   Hunger Vital Sign    Within the past 12 months, you worried that your food would run out before you got the money to buy more.: Patient declined    Within the past 12 months, the food you bought just didn't last and you didn't have money to get more.: Patient declined  Transportation Needs: Patient Declined (09/17/2023)   Received from [facility] - Transportation    In the past 12 months, has lack of transportation kept you from medical appointments or from getting medications?: Patient declined    Lack of Transportation (Non-Medical): Patient declined  Physical Activity: Not on file  Stress: Not on file  Social Connections: Not on file  Intimate Partner Violence: Not on file    Vital Signs: Blood pressure 110/80, pulse 82, temperature 98.2 F (36.8 C), resp. rate 16, height 5' 6 (1.676 m), weight 198 lb (89.8 kg), last menstrual period 06/29/1998, SpO2 94%.  Examination: General Appearance: The patient is well-developed,  well-nourished, and in no distress. Skin: Gross inspection of skin unremarkable. Head: normocephalic, no gross deformities. Eyes: no gross deformities noted. ENT: ears appear grossly normal no exudates. Neck: Supple. No thyromegaly. No LAD. Respiratory: no rhonchi noted. Cardiovascular: Normal S1 and S2 without murmur or rub. Extremities: No cyanosis. pulses are equal. Neurologic: Alert and oriented. No involuntary movements.  LABS: No results found for this or any previous visit (from the past 2160 hours).  Radiology: CT Chest Wo Contrast Result Date: 03/25/2024 EXAM: CT CHEST WITHOUT CONTRAST 03/17/2024 08:50:18 AM TECHNIQUE: CT of the chest was performed without the administration of intravenous contrast. Multiplanar reformatted images are provided for review. Automated exposure control, iterative reconstruction, and/or weight based adjustment of the  mA/kV was utilized to reduce the radiation dose to as low as reasonably achievable. COMPARISON: 02/21/2023 CLINICAL HISTORY: Post covid chronic cough, scarring. 20mo f/u - post covid chronic cough; 08/07/23 HPI; Pt is here for virtual pulmonary follow up; -Breathing has been about the same. Using dulera inhaler daily and this has been helping. Does not need albuterol ; -PFT previously reviewed, but did have updated CT chest a few months ago due to post covid chronic cough. Pt still has some coughing, may be better but then will catch something, like she has a cold now so is coughing some intermittently.; -some aortic atherosclerosis and 2 vessel coronary disease seen on CT chest. Discussed findings and pt will discuss further with PCP and maybe consider cardiology evaluation. May need statin. FINDINGS: MEDIASTINUM: Small inferior pericardial effusion. Mild coronary atherosclerosis of the LAD. The central airways are clear. LYMPH NODES: No mediastinal, hilar or axillary lymphadenopathy. LUNGS AND PLEURA: Biapical pleural parenchymal scarring. Prior  subpleural opacities have resolved. No focal consolidation or pulmonary edema. No pleural effusion or pneumothorax. SOFT TISSUES/BONES: Bilateral breast augmentation. No acute abnormality of the bones or soft tissues. UPPER ABDOMEN: Limited images of the upper abdomen demonstrates no acute abnormality. IMPRESSION: 1. No acute cardiopulmonary abnormality. Electronically signed by: Pinkie Pebbles MD 03/25/2024 12:51 AM EDT RP Workstation: [id number]    No results found.  No results found.  Assessment and Plan: Patient Active Problem List   Diagnosis Date Noted   Syncope 11/30/2019   Palpitations 11/22/2019   Mitral valve prolapse 10/21/2019   Other dysphagia 06/19/2019   Hx of adenomatous colonic polyps 06/10/2019   Bulging of cervical intervertebral disc 03/18/2017   Vitamin D  insufficiency 01/01/2017   Chronic pain syndrome 10/01/2016   Chronic feet pain (Location of Primary Source of Pain) (Bilateral) (L>R) (Burning) 10/01/2016   Plantar fasciitis (Location of Primary Source of Pain) (Bilateral) (R>L) 12/07/2015   Chronic hip pain (Location of Secondary source of pain) (Bilateral) (L>R) 12/07/2015   Chronic knee pain (Location of Tertiary source of pain) (Bilateral) (L>R) 12/07/2015   Chronic neck pain (Right) 12/07/2015   Chronic low back pain (Bilateral) (L>R) 12/07/2015   Chronic hand pain (Bilateral) (R>L) 12/07/2015   Elevated sedimentation rate 12/07/2015   Long term current use of opiate analgesic 09/11/2015   Long term prescription opiate use 09/11/2015   Opiate use (30 MME/day) 09/11/2015   Opiate dependence (HCC) 09/11/2015   Encounter for therapeutic drug level monitoring 09/11/2015   Encounter for chronic pain management 09/11/2015   Opioid-induced constipation (OIC) 09/11/2015   Neurogenic pain 09/11/2015   Neuropathic pain 09/11/2015   Generalized pain 09/11/2015   Irritable bowel syndrome 09/11/2015   History of bilateral breast implants 09/11/2015   GERD  (gastroesophageal reflux disease) 09/11/2015   Peptic ulcer disease 09/11/2015   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 Other hypersomnia (Primary) She has significant hypersomnia noted and my concern is for obstructive sleep apnea.  I will go ahead and get a sleep study done at this time.  Will continue with supportive care otherwise. - PSG Sleep Study; Future  2. Gastroesophageal reflux disease, unspecified whether esophagitis present PPIs recommended she is going to work on exercise and diet to try to lose some weight also.  3. Obesity, morbid (HCC) Patient needs to work on weight loss this will actually improve some of her symptoms significantly   General Counseling: I have discussed the findings of the evaluation  and examination with Demia.  I have also discussed any further diagnostic evaluation thatmay be needed or ordered today. Cadyn verbalizes understanding of the findings of todays visit. We also reviewed her medications today and discussed drug interactions and side effects including but not limited excessive drowsiness and altered mental states. We also discussed that there is always a risk not just to her but also people around her. she has been encouraged to call the office with any questions or concerns that should arise related to todays visit.  No orders of the defined types were placed in this encounter.    Time spent: 52  I have personally obtained a history, examined the patient, evaluated laboratory and imaging results, formulated the assessment and plan and placed orders.    Elfreda DELENA Bathe, MD [facility] Pulmonary and Critical Care Sleep medicine

## 2024-05-11 ENCOUNTER — Encounter (HOSPITAL_COMMUNITY): Payer: Self-pay

## 2024-05-11 LAB — PULMONARY FUNCTION TEST

## 2024-05-12 ENCOUNTER — Telehealth: Payer: Self-pay | Admitting: Internal Medicine

## 2024-05-12 NOTE — Telephone Encounter (Signed)
 SS order emailed to Henrietta w/ [facility]-Toni

## 2024-05-13 ENCOUNTER — Ambulatory Visit
Admission: RE | Admit: 2024-05-13 | Discharge: 2024-05-13 | Disposition: A | Discharge status: Home / Self Care | Source: Ambulatory Visit | Attending: Student | Admitting: Student

## 2024-05-13 DIAGNOSIS — R0789 Other chest pain: Secondary | ICD-10-CM | POA: Insufficient documentation

## 2024-05-13 DIAGNOSIS — I2510 Atherosclerotic heart disease of native coronary artery without angina pectoris: Secondary | ICD-10-CM | POA: Insufficient documentation

## 2024-05-13 MED ORDER — NITROGLYCERIN 0.4 MG SL SUBL
0.8000 mg | SUBLINGUAL_TABLET | Freq: Once | SUBLINGUAL | Status: AC
  Administered 2024-05-13: 0.8 mg via SUBLINGUAL

## 2024-05-13 MED ORDER — IOHEXOL 350 MG/ML SOLN
100.0000 mL | Freq: Once | INTRAVENOUS | Status: AC | PRN
  Administered 2024-05-13: 100 mL via INTRAVENOUS

## 2024-05-13 MED ORDER — DILTIAZEM HCL 25 MG/5ML IV SOLN: 10.0000 mg | INTRAVENOUS | Status: DC | PRN

## 2024-05-13 MED ORDER — METOPROLOL TARTRATE 5 MG/5ML IV SOLN: 10.0000 mg | INTRAVENOUS | Status: DC | PRN

## 2024-05-13 NOTE — Progress Notes (Signed)
 Patient tolerated CT well. Drank water after. Vital signs stable encourage to drink water throughout day.Reasons explained and verbalized understanding. Ambulated steady gait.

## 2024-06-02 DIAGNOSIS — R55 Syncope and collapse: Secondary | ICD-10-CM | POA: Diagnosis not present

## 2024-06-02 DIAGNOSIS — I2510 Atherosclerotic heart disease of native coronary artery without angina pectoris: Secondary | ICD-10-CM | POA: Diagnosis not present

## 2024-06-02 DIAGNOSIS — I341 Nonrheumatic mitral (valve) prolapse: Secondary | ICD-10-CM | POA: Diagnosis not present

## 2024-06-02 DIAGNOSIS — R002 Palpitations: Secondary | ICD-10-CM | POA: Diagnosis not present

## 2024-06-02 DIAGNOSIS — R0609 Other forms of dyspnea: Secondary | ICD-10-CM | POA: Diagnosis not present

## 2024-06-16 DIAGNOSIS — H5203 Hypermetropia, bilateral: Secondary | ICD-10-CM | POA: Diagnosis not present

## 2024-06-16 DIAGNOSIS — H259 Unspecified age-related cataract: Secondary | ICD-10-CM | POA: Diagnosis not present

## 2024-06-16 DIAGNOSIS — H52223 Regular astigmatism, bilateral: Secondary | ICD-10-CM | POA: Diagnosis not present

## 2024-06-16 DIAGNOSIS — H524 Presbyopia: Secondary | ICD-10-CM | POA: Diagnosis not present

## 2024-07-08 ENCOUNTER — Telehealth: Payer: Self-pay | Admitting: Internal Medicine

## 2024-07-08 DIAGNOSIS — R002 Palpitations: Secondary | ICD-10-CM | POA: Diagnosis not present

## 2024-07-08 NOTE — Telephone Encounter (Signed)
 [facility] unable to reach patient to schedule SS-Toni

## 2024-08-04 DIAGNOSIS — I341 Nonrheumatic mitral (valve) prolapse: Secondary | ICD-10-CM | POA: Diagnosis not present

## 2024-08-04 DIAGNOSIS — I2510 Atherosclerotic heart disease of native coronary artery without angina pectoris: Secondary | ICD-10-CM | POA: Diagnosis not present

## 2024-08-04 DIAGNOSIS — R42 Dizziness and giddiness: Secondary | ICD-10-CM | POA: Diagnosis not present

## 2024-08-04 DIAGNOSIS — R55 Syncope and collapse: Secondary | ICD-10-CM | POA: Diagnosis not present

## 2024-08-04 DIAGNOSIS — R0609 Other forms of dyspnea: Secondary | ICD-10-CM | POA: Diagnosis not present

## 2024-08-04 DIAGNOSIS — R002 Palpitations: Secondary | ICD-10-CM | POA: Diagnosis not present

## 2024-08-09 ENCOUNTER — Ambulatory Visit: Admitting: Internal Medicine

## 2024-09-06 DIAGNOSIS — R2689 Other abnormalities of gait and mobility: Secondary | ICD-10-CM | POA: Diagnosis not present

## 2024-09-06 DIAGNOSIS — G479 Sleep disorder, unspecified: Secondary | ICD-10-CM | POA: Diagnosis not present

## 2024-09-06 DIAGNOSIS — G629 Polyneuropathy, unspecified: Secondary | ICD-10-CM | POA: Diagnosis not present

## 2024-09-06 DIAGNOSIS — R202 Paresthesia of skin: Secondary | ICD-10-CM | POA: Diagnosis not present

## 2024-09-06 DIAGNOSIS — R200 Anesthesia of skin: Secondary | ICD-10-CM | POA: Diagnosis not present

## 2024-09-06 DIAGNOSIS — R42 Dizziness and giddiness: Secondary | ICD-10-CM | POA: Diagnosis not present

## 2024-09-06 DIAGNOSIS — R208 Other disturbances of skin sensation: Secondary | ICD-10-CM | POA: Diagnosis not present

## 2024-09-09 ENCOUNTER — Telehealth: Payer: Self-pay

## 2024-09-09 ENCOUNTER — Telehealth: Payer: Self-pay | Admitting: Family Medicine

## 2024-09-09 ENCOUNTER — Other Ambulatory Visit: Payer: Self-pay | Admitting: Family Medicine

## 2024-09-09 DIAGNOSIS — Z1231 Encounter for screening mammogram for malignant neoplasm of breast: Secondary | ICD-10-CM

## 2024-09-09 NOTE — Telephone Encounter (Signed)
 Spoke to pt to inform her that she needed a referral since it has been longer than 3 years she stated that she will reach out to her primary

## 2024-09-09 NOTE — Telephone Encounter (Signed)
 Pt lvm to schedule colonoscopy with EGD.  Pt will need a referral sent to the office because it has been more than 3 years since her last office visit.  Her last office visit in 2021.  She will need to contact her PCP to let them know what GI Issues she is experiencing and request them to send a referral to our office or [facility].  No procedures have been performed by our office.  Thanks,  [place], CMA

## 2024-09-21 DIAGNOSIS — R109 Unspecified abdominal pain: Secondary | ICD-10-CM | POA: Diagnosis not present

## 2024-09-21 DIAGNOSIS — Z0000 Encounter for general adult medical examination without abnormal findings: Secondary | ICD-10-CM | POA: Diagnosis not present

## 2024-09-21 DIAGNOSIS — R197 Diarrhea, unspecified: Secondary | ICD-10-CM | POA: Diagnosis not present

## 2024-10-26 ENCOUNTER — Ambulatory Visit
Admission: RE | Admit: 2024-10-26 | Discharge: 2024-10-26 | Disposition: A | Discharge status: Home / Self Care | Source: Ambulatory Visit | Attending: Family Medicine | Admitting: Family Medicine

## 2024-10-26 DIAGNOSIS — Z1231 Encounter for screening mammogram for malignant neoplasm of breast: Secondary | ICD-10-CM | POA: Diagnosis present

## 2024-11-06 ENCOUNTER — Other Ambulatory Visit: Payer: Self-pay

## 2024-11-06 ENCOUNTER — Emergency Department
Admission: EM | Admit: 2024-11-06 | Discharge: 2024-11-06 | Disposition: A | Discharge status: Home / Self Care | Attending: Emergency Medicine | Admitting: Emergency Medicine

## 2024-11-06 ENCOUNTER — Emergency Department

## 2024-11-06 DIAGNOSIS — R109 Unspecified abdominal pain: Secondary | ICD-10-CM | POA: Diagnosis present

## 2024-11-06 DIAGNOSIS — R300 Dysuria: Secondary | ICD-10-CM | POA: Insufficient documentation

## 2024-11-06 DIAGNOSIS — R339 Retention of urine, unspecified: Secondary | ICD-10-CM | POA: Insufficient documentation

## 2024-11-06 DIAGNOSIS — K5900 Constipation, unspecified: Secondary | ICD-10-CM | POA: Insufficient documentation

## 2024-11-06 DIAGNOSIS — D72829 Elevated white blood cell count, unspecified: Secondary | ICD-10-CM | POA: Insufficient documentation

## 2024-11-06 LAB — URINALYSIS, ROUTINE W REFLEX MICROSCOPIC
Bacteria, UA: NONE SEEN
Bilirubin Urine: NEGATIVE
Glucose, UA: NEGATIVE mg/dL
Hgb urine dipstick: NEGATIVE
Ketones, ur: NEGATIVE mg/dL
Nitrite: NEGATIVE
Protein, ur: NEGATIVE mg/dL
Specific Gravity, Urine: 1.01 (ref 1.005–1.030)
pH: 7 (ref 5.0–8.0)

## 2024-11-06 LAB — CBC WITH DIFFERENTIAL/PLATELET
Abs Immature Granulocytes: 0.09 K/uL — ABNORMAL HIGH (ref 0.00–0.07)
Basophils Absolute: 0.1 K/uL (ref 0.0–0.1)
Basophils Relative: 0 %
Eosinophils Absolute: 0 K/uL (ref 0.0–0.5)
Eosinophils Relative: 0 %
HCT: 44.7 % (ref 36.0–46.0)
Hemoglobin: 14.8 g/dL (ref 12.0–15.0)
Immature Granulocytes: 1 %
Lymphocytes Relative: 18 %
Lymphs Abs: 2.3 K/uL (ref 0.7–4.0)
MCH: 30.5 pg (ref 26.0–34.0)
MCHC: 33.1 g/dL (ref 30.0–36.0)
MCV: 92 fL (ref 80.0–100.0)
Monocytes Absolute: 1.2 K/uL — ABNORMAL HIGH (ref 0.1–1.0)
Monocytes Relative: 9 %
Neutro Abs: 9.4 K/uL — ABNORMAL HIGH (ref 1.7–7.7)
Neutrophils Relative %: 72 %
Platelets: 220 K/uL (ref 150–400)
RBC: 4.86 MIL/uL (ref 3.87–5.11)
RDW: 12.1 % (ref 11.5–15.5)
WBC: 13.1 K/uL — ABNORMAL HIGH (ref 4.0–10.5)
nRBC: 0 % (ref 0.0–0.2)

## 2024-11-06 LAB — COMPREHENSIVE METABOLIC PANEL WITH GFR
ALT: 43 U/L (ref 0–44)
AST: 40 U/L (ref 15–41)
Albumin: 4.1 g/dL (ref 3.5–5.0)
Alkaline Phosphatase: 114 U/L (ref 38–126)
Anion gap: 14 (ref 5–15)
BUN: 9 mg/dL (ref 6–20)
CO2: 21 mmol/L — ABNORMAL LOW (ref 22–32)
Calcium: 9.5 mg/dL (ref 8.9–10.3)
Chloride: 100 mmol/L (ref 98–111)
Creatinine, Ser: 0.91 mg/dL (ref 0.44–1.00)
GFR, Estimated: >60 mL/min
Glucose, Bld: 142 mg/dL — ABNORMAL HIGH (ref 70–99)
Potassium: 4.1 mmol/L (ref 3.5–5.1)
Sodium: 135 mmol/L (ref 135–145)
Total Bilirubin: 0.6 mg/dL (ref 0.0–1.2)
Total Protein: 7.2 g/dL (ref 6.5–8.1)

## 2024-11-06 LAB — LIPASE, BLOOD: Lipase: 13 U/L (ref 11–51)

## 2024-11-06 MED ORDER — IOHEXOL 300 MG/ML  SOLN
100.0000 mL | Freq: Once | INTRAMUSCULAR | Status: AC | PRN
  Administered 2024-11-06: 100 mL via INTRAVENOUS

## 2024-11-06 MED ORDER — LACTATED RINGERS IV BOLUS
1000.0000 mL | Freq: Once | INTRAVENOUS | Status: AC
  Administered 2024-11-06: 1000 mL via INTRAVENOUS

## 2024-11-06 MED ORDER — MORPHINE SULFATE (PF) 4 MG/ML IV SOLN
4.0000 mg | Freq: Once | INTRAVENOUS | Status: AC
  Administered 2024-11-06: 4 mg via INTRAVENOUS
  Filled 2024-11-06: qty 1

## 2024-11-06 MED ORDER — ONDANSETRON HCL 4 MG/2ML IJ SOLN
4.0000 mg | Freq: Once | INTRAMUSCULAR | Status: AC
  Administered 2024-11-06: 4 mg via INTRAVENOUS
  Filled 2024-11-06: qty 2

## 2024-11-06 MED ORDER — POLYETHYLENE GLYCOL 3350 17 G PO PACK
34.0000 g | PACK | Freq: Once | ORAL | Status: AC
  Administered 2024-11-06: 34 g via ORAL
  Filled 2024-11-06: qty 2

## 2024-11-06 NOTE — ED Triage Notes (Signed)
 C/O constipation and dysuria.  C/O being unable to empty bladder x 3 days and feeling constipated x 3 days.  States having loose /watery stools but it is coming around stool in rectum.  C/O abd pain.

## 2024-11-06 NOTE — ED Notes (Signed)
 Post in and out bladder scan 

## 2024-11-06 NOTE — ED Provider Notes (Signed)
 "  [facility] Provider Note    Event Date/Time   First MD Initiated Contact with Patient 11/06/24 [phone]     (approximate)   History   Abdominal Pain   HPI  Margaret Diaz is a 59 y.o. female who presents to the ED for evaluation of Abdominal Pain   Review of PCP visit from 1/14.  Seen for dysuria and concerns for UTI.  Started on 5 days of ciprofloxacin.  Urine culture negative  Patient without surgical abdominal history presents to the ED with 3 days of constipation, increasing abdominal and anorectal discomfort and now developing urinary retention over the past 12 hours or so.  No fevers, no emesis or vaginal symptoms.  Prior to my evaluation, nursing staff did a postvoid residual after patient attempted to void, greater than 900 cc, In-N-Out catheterization with 7-800 out.  After this repeat bladder scan with over 400 and patient has the continued sensation of needing to void but inability to do so.   Physical Exam   Triage Vital Signs: ED Triage Vitals  Encounter Vitals Group     BP 11/06/24 0938 106/82     Girls Systolic BP Percentile --      Girls Diastolic BP Percentile --      Boys Systolic BP Percentile --      Boys Diastolic BP Percentile --      Pulse Rate 11/06/24 0936 (!) 102     Resp 11/06/24 0936 16     Temp 11/06/24 0936 97.6 F (36.4 C)     Temp Source 11/06/24 0936 Oral     SpO2 11/06/24 0936 99 %     Weight 11/06/24 0937 197 lb 15.6 oz (89.8 kg)     Height --      Head Circumference --      Peak Flow --      Pain Score 11/06/24 0937 8     Pain Loc --      Pain Education --      Exclude from Growth Chart --     Most recent vital signs: Vitals:   11/06/24 1100 11/06/24 1309  BP: 129/79 127/77  Pulse: 80 81  Resp:  18  Temp:  97.8 F (36.6 C)  SpO2:  99%    General: Awake, no distress.  CV:  Good peripheral perfusion.  Resp:  Normal effort.  Abd:  No distention.  Diffuse and mild tenderness, no peritoneal  features. MSK:  No deformity noted.  Neuro:  No focal deficits appreciated. Other:     ED Results / Procedures / Treatments   Labs (all labs ordered are listed, but only abnormal results are displayed) Labs Reviewed  COMPREHENSIVE METABOLIC PANEL WITH GFR - Abnormal; Notable for the following components:      Result Value   CO2 21 (*)    Glucose, Bld 142 (*)    All other components within normal limits  CBC WITH DIFFERENTIAL/PLATELET - Abnormal; Notable for the following components:   WBC 13.1 (*)    Neutro Abs 9.4 (*)    Monocytes Absolute 1.2 (*)    Abs Immature Granulocytes 0.09 (*)    All other components within normal limits  URINALYSIS, ROUTINE W REFLEX MICROSCOPIC - Abnormal; Notable for the following components:   Color, Urine YELLOW (*)    APPearance CLEAR (*)    Leukocytes,Ua TRACE (*)    All other components within normal limits  URINE CULTURE  LIPASE, BLOOD  EKG   RADIOLOGY CT abdomen/pelvis interpreted by me with constipation, fecal impaction and mild perirectal stranding without signs of perforation  Official radiology report(s): CT ABDOMEN PELVIS W CONTRAST Result Date: 11/06/2024 EXAM: CT ABDOMEN AND PELVIS WITH CONTRAST 11/06/2024 11:49:40 AM TECHNIQUE: CT of the abdomen and pelvis was performed with the administration of 100 mL of iohexol  (OMNIPAQUE ) 300 MG/ML solution. Multiplanar reformatted images are provided for review. Automated exposure control, iterative reconstruction, and/or weight-based adjustment of the mA/kV was utilized to reduce the radiation dose to as low as reasonably achievable. COMPARISON: 03/10/2024. Mild bibasilar subsegmental atelectasis. CLINICAL HISTORY: Diffuse abd pain, constipation, dysuria. FINDINGS: LOWER CHEST: Mild bibasilar subsegmental atelectasis. LIVER: The liver is unremarkable. GALLBLADDER AND BILE DUCTS: Gallbladder is unremarkable. No biliary ductal dilatation. SPLEEN: No acute abnormality. PANCREAS: No acute  abnormality. ADRENAL GLANDS: No acute abnormality. KIDNEYS, URETERS AND BLADDER: No stones in the kidneys or ureters. No hydronephrosis. No perinephric or periureteral stranding. A foley catheter is in place with decompressed bladder. Air in the bladder, likely iatrogenic. GI AND BOWEL: Stomach demonstrates no acute abnormality. There is no bowel obstruction. Moderate rectal stool burden with perirectal inflammatory stranding and free fluid. Appendix is not visualized. PERITONEUM AND RETROPERITONEUM: Free fluid is present in the pelvis, associated with perirectal inflammatory stranding. No free air. VASCULATURE: Aorta is normal in caliber. LYMPH NODES: No lymphadenopathy. REPRODUCTIVE ORGANS: The uterus is surgically absent. No adnexal masses. BONES AND SOFT TISSUES: No acute osseous abnormality. No focal soft tissue abnormality. IMPRESSION: 1. Moderate rectal stool burden and perirectal inflammatory stranding may represent stercoral colitis/proctitis. Electronically signed by: Michaeline Blanch MD 11/06/2024 11:59 AM EST RP Workstation: [id number]    PROCEDURES and INTERVENTIONS:  .Fecal disimpaction  Date/Time: 11/06/2024 1:23 PM  Performed by: Claudene Rover, MD Authorized by: Claudene Rover, MD  Consent: Verbal consent obtained Risks and benefits: risks, benefits and alternatives were discussed Patient tolerance: patient tolerated the procedure well with no immediate complications Comments: We did discuss the possibility of perforation considering associated inflammation around her rectum.      Medications  morphine  (PF) 4 MG/ML injection 4 mg (4 mg Intravenous Given 11/06/24 1036)  ondansetron  (ZOFRAN ) injection 4 mg (4 mg Intravenous Given 11/06/24 1036)  lactated ringers  bolus 1,000 mL (0 mLs Intravenous Stopped 11/06/24 1302)  iohexol  (OMNIPAQUE ) 300 MG/ML solution 100 mL (100 mLs Intravenous Contrast Given 11/06/24 1139)  polyethylene glycol (MIRALAX  / GLYCOLAX ) packet 34 g (34 g Oral Given 11/06/24  1302)     IMPRESSION / MDM / ASSESSMENT AND PLAN / ED COURSE  I reviewed the triage vital signs and the nursing notes.  Differential diagnosis includes, but is not limited to, SBO, constipation,   {Patient presents with symptoms of an acute illness or injury that is potentially life-threatening.  Patient presents to the ED due to constipation and urinary retention.  She is being treated for UTI with Cipro but culture returned negative and she remains on antibiotics for the last couple days.  No surgical history to her abdomen.  Minimal diffuse tenderness.  Generally benign exam.  CT obtained with signs of impacted stool and mild stercoral colitis.  After urinary catheterization, manual disimpaction of stool and improving symptoms.  Remove catheter prior to discharge, discussed bowel regimen at home and patient is suitable for trial of outpatient management.  Discussed ED return precautions.  Leukocytosis is noted, essentially normal metabolic panel, urine and lipase.  Clinical Course as of 11/06/24 1329  Sat Nov 06, 2024  1211 Briefly discussed  the case over the phone with Dr. Jordis who scrubbed in the OR.  He agrees it is safe to manually disimpact and this would be helpful, subsequent bowel regimen. [DS]  1219 Manual disimpaction [DS]  1303 Reassessed and discussed bowel regimen at home, plan of care.  She would like to have the Foley catheter removed, which I agree with.  We discussed what to look out for at home, finishing antibiotic course, MiraLAX , suppositories, enema, PCP follow-up and ED return precautions [DS]    Clinical Course User Index [DS] Claudene Rover, MD     FINAL CLINICAL IMPRESSION(S) / ED DIAGNOSES   Final diagnoses:  Constipation, unspecified constipation type     Rx / DC Orders   ED Discharge Orders     None        Note:  This document was prepared using Dragon voice recognition software and may include unintentional dictation errors.   Claudene Rover, MD 11/06/24 1329  "

## 2024-11-06 NOTE — Discharge Instructions (Addendum)
 Use MiraLAX , or its generic equivalent polyethylene glycol, for your constipation.    Mix this white powder in your favorite noncarbonated drink, such as milk, water or juice.  To begin with and to get things moving, use 2 capfuls up to twice per day.    Once you are passing more regular bowel movements, cut back to about 1 capful once or twice a day.    It is safe to use suppositories or enemas with this medication.  Please stay hydrated and drink plenty of fluids while you are taking this medication.

## 2024-11-06 NOTE — ED Notes (Signed)
Bladder scan 979ml

## 2024-11-07 LAB — URINE CULTURE: Culture: NO GROWTH
